# Patient Record
Sex: Male | Born: 1950 | Race: Asian | Hispanic: No | Marital: Married | Smoking: Former smoker
Health system: Southern US, Community
[De-identification: ages and names within clinical notes are randomized; demographics above are authoritative.]

## PROBLEM LIST (undated history)

## (undated) DIAGNOSIS — I209 Angina pectoris, unspecified: Secondary | ICD-10-CM

## (undated) DIAGNOSIS — E119 Type 2 diabetes mellitus without complications: Secondary | ICD-10-CM

## (undated) DIAGNOSIS — I219 Acute myocardial infarction, unspecified: Secondary | ICD-10-CM

## (undated) DIAGNOSIS — I10 Essential (primary) hypertension: Secondary | ICD-10-CM

## (undated) HISTORY — PX: OTHER SURGICAL HISTORY: SHX169

## (undated) HISTORY — PX: NO PAST SURGERIES: SHX2092

---

## 1998-09-15 DIAGNOSIS — I10 Essential (primary) hypertension: Secondary | ICD-10-CM

## 1998-09-15 HISTORY — DX: Essential (primary) hypertension: I10

## 2007-03-03 ENCOUNTER — Emergency Department (HOSPITAL_COMMUNITY): Admission: EM | Admit: 2007-03-03 | Discharge: 2007-03-03 | Payer: Self-pay | Admitting: Emergency Medicine

## 2010-03-04 ENCOUNTER — Ambulatory Visit: Payer: Self-pay | Admitting: Internal Medicine

## 2010-03-04 ENCOUNTER — Encounter (INDEPENDENT_AMBULATORY_CARE_PROVIDER_SITE_OTHER): Payer: Self-pay | Admitting: Family Medicine

## 2010-03-04 LAB — CONVERTED CEMR LAB
ALT: 18 units/L (ref 0–53)
AST: 16 units/L (ref 0–37)
Alkaline Phosphatase: 58 units/L (ref 39–117)
Basophils Relative: 0 % (ref 0–1)
CO2: 21 meq/L (ref 19–32)
MCHC: 34.3 g/dL (ref 30.0–36.0)
Magnesium: 2.1 mg/dL (ref 1.5–2.5)
Monocytes Relative: 8 % (ref 3–12)
Neutro Abs: 4.1 10*3/uL (ref 1.7–7.7)
Neutrophils Relative %: 57 % (ref 43–77)
Platelets: 311 10*3/uL (ref 150–400)
RBC: 4.03 M/uL — ABNORMAL LOW (ref 4.22–5.81)
Sodium: 141 meq/L (ref 135–145)
Total Bilirubin: 0.3 mg/dL (ref 0.3–1.2)
Total Protein: 7.7 g/dL (ref 6.0–8.3)
WBC: 7.2 10*3/uL (ref 4.0–10.5)

## 2010-03-07 ENCOUNTER — Encounter (INDEPENDENT_AMBULATORY_CARE_PROVIDER_SITE_OTHER): Payer: Self-pay | Admitting: Family Medicine

## 2010-03-07 LAB — CONVERTED CEMR LAB
Iron: 71 ug/dL (ref 42–165)
Saturation Ratios: 28 % (ref 20–55)
TIBC: 257 ug/dL (ref 215–435)
UIBC: 186 ug/dL

## 2010-03-19 ENCOUNTER — Ambulatory Visit: Payer: Self-pay | Admitting: Family Medicine

## 2010-03-20 ENCOUNTER — Encounter (INDEPENDENT_AMBULATORY_CARE_PROVIDER_SITE_OTHER): Payer: Self-pay | Admitting: Family Medicine

## 2010-03-20 LAB — CONVERTED CEMR LAB
BUN: 31 mg/dL — ABNORMAL HIGH (ref 6–23)
Calcium: 10.3 mg/dL (ref 8.4–10.5)
Creatinine, Ser: 1.43 mg/dL (ref 0.40–1.50)
Glucose, Bld: 104 mg/dL — ABNORMAL HIGH (ref 70–99)
Hgb A1c MFr Bld: 6.9 % — ABNORMAL HIGH (ref <5.7)
Phosphorus: 2.4 mg/dL (ref 2.3–4.6)

## 2010-04-05 ENCOUNTER — Ambulatory Visit: Payer: Self-pay | Admitting: Internal Medicine

## 2010-05-09 ENCOUNTER — Ambulatory Visit: Payer: Self-pay | Admitting: Internal Medicine

## 2010-05-09 ENCOUNTER — Encounter (INDEPENDENT_AMBULATORY_CARE_PROVIDER_SITE_OTHER): Payer: Self-pay | Admitting: Family Medicine

## 2010-05-09 LAB — CONVERTED CEMR LAB
BUN: 29 mg/dL — ABNORMAL HIGH (ref 6–23)
Basophils Relative: 0 % (ref 0–1)
Calcium: 9.9 mg/dL (ref 8.4–10.5)
Cholesterol: 256 mg/dL — ABNORMAL HIGH (ref 0–200)
Eosinophils Absolute: 0.7 10*3/uL (ref 0.0–0.7)
Eosinophils Relative: 10 % — ABNORMAL HIGH (ref 0–5)
Glucose, Bld: 119 mg/dL — ABNORMAL HIGH (ref 70–99)
HCT: 43.4 % (ref 39.0–52.0)
Lymphs Abs: 2.4 10*3/uL (ref 0.7–4.0)
MCHC: 30.9 g/dL (ref 30.0–36.0)
MCV: 96.9 fL (ref 78.0–100.0)
Monocytes Relative: 6 % (ref 3–12)
Neutrophils Relative %: 51 % (ref 43–77)
Platelets: 315 10*3/uL (ref 150–400)
Potassium: 4 meq/L (ref 3.5–5.3)
Sodium: 139 meq/L (ref 135–145)
Triglycerides: 343 mg/dL — ABNORMAL HIGH (ref <150)
VLDL: 69 mg/dL — ABNORMAL HIGH (ref 0–40)

## 2013-04-15 DIAGNOSIS — E119 Type 2 diabetes mellitus without complications: Secondary | ICD-10-CM

## 2013-04-15 HISTORY — DX: Type 2 diabetes mellitus without complications: E11.9

## 2013-05-09 ENCOUNTER — Encounter (HOSPITAL_COMMUNITY): Payer: Self-pay | Admitting: Certified Registered"

## 2013-05-09 ENCOUNTER — Encounter (HOSPITAL_COMMUNITY)
Admission: EM | Disposition: A | Discharge status: Home / Self Care | Payer: Self-pay | Source: Home / Self Care | Attending: Critical Care Medicine

## 2013-05-09 ENCOUNTER — Emergency Department (HOSPITAL_COMMUNITY): Payer: Medicaid Other

## 2013-05-09 ENCOUNTER — Ambulatory Visit (HOSPITAL_COMMUNITY): Admit: 2013-05-09 | Payer: Self-pay | Admitting: Cardiology

## 2013-05-09 ENCOUNTER — Inpatient Hospital Stay (HOSPITAL_COMMUNITY)
Admission: EM | Admit: 2013-05-09 | Discharge: 2013-05-19 | DRG: 246 | Disposition: A | Discharge status: Home / Self Care | Payer: Medicaid Other | Attending: Cardiology | Admitting: Cardiology

## 2013-05-09 ENCOUNTER — Inpatient Hospital Stay (HOSPITAL_COMMUNITY): Payer: Medicaid Other

## 2013-05-09 DIAGNOSIS — R000 Tachycardia, unspecified: Secondary | ICD-10-CM

## 2013-05-09 DIAGNOSIS — Z87891 Personal history of nicotine dependence: Secondary | ICD-10-CM

## 2013-05-09 DIAGNOSIS — I10 Essential (primary) hypertension: Secondary | ICD-10-CM

## 2013-05-09 DIAGNOSIS — J1569 Pneumonia due to other gram-negative bacteria: Secondary | ICD-10-CM | POA: Diagnosis not present

## 2013-05-09 DIAGNOSIS — I472 Ventricular tachycardia, unspecified: Secondary | ICD-10-CM

## 2013-05-09 DIAGNOSIS — I498 Other specified cardiac arrhythmias: Secondary | ICD-10-CM | POA: Diagnosis present

## 2013-05-09 DIAGNOSIS — E8779 Other fluid overload: Secondary | ICD-10-CM | POA: Diagnosis not present

## 2013-05-09 DIAGNOSIS — E872 Acidosis, unspecified: Secondary | ICD-10-CM

## 2013-05-09 DIAGNOSIS — G9340 Encephalopathy, unspecified: Secondary | ICD-10-CM

## 2013-05-09 DIAGNOSIS — I213 ST elevation (STEMI) myocardial infarction of unspecified site: Secondary | ICD-10-CM

## 2013-05-09 DIAGNOSIS — Z955 Presence of coronary angioplasty implant and graft: Secondary | ICD-10-CM

## 2013-05-09 DIAGNOSIS — I2119 ST elevation (STEMI) myocardial infarction involving other coronary artery of inferior wall: Principal | ICD-10-CM | POA: Diagnosis present

## 2013-05-09 DIAGNOSIS — G931 Anoxic brain damage, not elsewhere classified: Secondary | ICD-10-CM | POA: Diagnosis present

## 2013-05-09 DIAGNOSIS — I4901 Ventricular fibrillation: Secondary | ICD-10-CM | POA: Diagnosis present

## 2013-05-09 DIAGNOSIS — Z79899 Other long term (current) drug therapy: Secondary | ICD-10-CM

## 2013-05-09 DIAGNOSIS — J9601 Acute respiratory failure with hypoxia: Secondary | ICD-10-CM

## 2013-05-09 DIAGNOSIS — J690 Pneumonitis due to inhalation of food and vomit: Secondary | ICD-10-CM | POA: Diagnosis not present

## 2013-05-09 DIAGNOSIS — R570 Cardiogenic shock: Secondary | ICD-10-CM | POA: Diagnosis present

## 2013-05-09 DIAGNOSIS — Z7902 Long term (current) use of antithrombotics/antiplatelets: Secondary | ICD-10-CM

## 2013-05-09 DIAGNOSIS — IMO0002 Reserved for concepts with insufficient information to code with codable children: Secondary | ICD-10-CM | POA: Diagnosis not present

## 2013-05-09 DIAGNOSIS — Z7982 Long term (current) use of aspirin: Secondary | ICD-10-CM

## 2013-05-09 DIAGNOSIS — I4729 Other ventricular tachycardia: Secondary | ICD-10-CM | POA: Diagnosis present

## 2013-05-09 DIAGNOSIS — I2510 Atherosclerotic heart disease of native coronary artery without angina pectoris: Secondary | ICD-10-CM | POA: Diagnosis present

## 2013-05-09 DIAGNOSIS — N179 Acute kidney failure, unspecified: Secondary | ICD-10-CM

## 2013-05-09 DIAGNOSIS — E7800 Pure hypercholesterolemia, unspecified: Secondary | ICD-10-CM | POA: Diagnosis present

## 2013-05-09 DIAGNOSIS — E876 Hypokalemia: Secondary | ICD-10-CM

## 2013-05-09 DIAGNOSIS — F05 Delirium due to known physiological condition: Secondary | ICD-10-CM | POA: Diagnosis not present

## 2013-05-09 DIAGNOSIS — I219 Acute myocardial infarction, unspecified: Secondary | ICD-10-CM

## 2013-05-09 DIAGNOSIS — E119 Type 2 diabetes mellitus without complications: Secondary | ICD-10-CM | POA: Diagnosis present

## 2013-05-09 DIAGNOSIS — D649 Anemia, unspecified: Secondary | ICD-10-CM | POA: Diagnosis not present

## 2013-05-09 DIAGNOSIS — J156 Pneumonia due to other aerobic Gram-negative bacteria: Secondary | ICD-10-CM | POA: Diagnosis not present

## 2013-05-09 DIAGNOSIS — I469 Cardiac arrest, cause unspecified: Secondary | ICD-10-CM

## 2013-05-09 DIAGNOSIS — J9600 Acute respiratory failure, unspecified whether with hypoxia or hypercapnia: Secondary | ICD-10-CM | POA: Diagnosis present

## 2013-05-09 DIAGNOSIS — Z9911 Dependence on respirator [ventilator] status: Secondary | ICD-10-CM

## 2013-05-09 DIAGNOSIS — I2582 Chronic total occlusion of coronary artery: Secondary | ICD-10-CM | POA: Diagnosis present

## 2013-05-09 HISTORY — DX: Essential (primary) hypertension: I10

## 2013-05-09 HISTORY — PX: LEFT HEART CATHETERIZATION WITH CORONARY ANGIOGRAM: SHX5451

## 2013-05-09 HISTORY — DX: Angina pectoris, unspecified: I20.9

## 2013-05-09 HISTORY — DX: Type 2 diabetes mellitus without complications: E11.9

## 2013-05-09 HISTORY — DX: Acute myocardial infarction, unspecified: I21.9

## 2013-05-09 LAB — CBC
MCH: 30.4 pg (ref 26.0–34.0)
MCHC: 34.7 g/dL (ref 30.0–36.0)
MCV: 87.6 fL (ref 78.0–100.0)
Platelets: 274 10*3/uL (ref 150–400)
Platelets: 325 10*3/uL (ref 150–400)
RBC: 3.38 MIL/uL — ABNORMAL LOW (ref 4.22–5.81)
RDW: 13 % (ref 11.5–15.5)
RDW: 13 % (ref 11.5–15.5)
WBC: 18.7 10*3/uL — ABNORMAL HIGH (ref 4.0–10.5)
WBC: 9.2 10*3/uL (ref 4.0–10.5)

## 2013-05-09 LAB — GLUCOSE, CAPILLARY
Glucose-Capillary: 554 mg/dL (ref 70–99)
Glucose-Capillary: 507 mg/dL — ABNORMAL HIGH (ref 70–99)
Glucose-Capillary: 255 mg/dL — ABNORMAL HIGH (ref 70–99)
Glucose-Capillary: 249 mg/dL — ABNORMAL HIGH (ref 70–99)
Glucose-Capillary: 234 mg/dL — ABNORMAL HIGH (ref 70–99)

## 2013-05-09 LAB — MAGNESIUM: Magnesium: 2 mg/dL (ref 1.5–2.5)

## 2013-05-09 LAB — COMPREHENSIVE METABOLIC PANEL
ALT: 204 U/L — ABNORMAL HIGH (ref 0–53)
ALT: 225 U/L — ABNORMAL HIGH (ref 0–53)
AST: 226 U/L — ABNORMAL HIGH (ref 0–37)
Albumin: 2.3 g/dL — ABNORMAL LOW (ref 3.5–5.2)
Alkaline Phosphatase: 75 U/L (ref 39–117)
Alkaline Phosphatase: 59 U/L (ref 39–117)
CO2: 19 mEq/L (ref 19–32)
CO2: 16 mEq/L — ABNORMAL LOW (ref 19–32)
Chloride: 101 mEq/L (ref 96–112)
Creatinine, Ser: 1.44 mg/dL — ABNORMAL HIGH (ref 0.50–1.35)
GFR calc Af Amer: 52 mL/min — ABNORMAL LOW (ref >90)
GFR calc non Af Amer: 45 mL/min — ABNORMAL LOW (ref >90)
GFR calc non Af Amer: 53 mL/min — ABNORMAL LOW (ref >90)
Glucose, Bld: 523 mg/dL — ABNORMAL HIGH (ref 70–99)
Potassium: 2.4 mEq/L — CL (ref 3.5–5.1)
Potassium: 3.2 mEq/L — ABNORMAL LOW (ref 3.5–5.1)
Sodium: 134 mEq/L — ABNORMAL LOW (ref 135–145)
Sodium: 138 mEq/L (ref 135–145)
Total Bilirubin: 0.1 mg/dL — ABNORMAL LOW (ref 0.3–1.2)
Total Bilirubin: 0.1 mg/dL — ABNORMAL LOW (ref 0.3–1.2)

## 2013-05-09 LAB — POCT I-STAT 3, ART BLOOD GAS (G3+)
O2 Saturation: 100 %
O2 Saturation: 93 %
Patient temperature: 32.8
Patient temperature: 32.8
TCO2: 12 mmol/L (ref 0–100)
TCO2: 13 mmol/L (ref 0–100)

## 2013-05-09 LAB — BASIC METABOLIC PANEL
BUN: 33 mg/dL — ABNORMAL HIGH (ref 6–23)
BUN: 31 mg/dL — ABNORMAL HIGH (ref 6–23)
BUN: 25 mg/dL — ABNORMAL HIGH (ref 6–23)
CO2: 16 mEq/L — ABNORMAL LOW (ref 19–32)
CO2: 13 mEq/L — ABNORMAL LOW (ref 19–32)
CO2: 12 mEq/L — ABNORMAL LOW (ref 19–32)
Calcium: 7.5 mg/dL — ABNORMAL LOW (ref 8.4–10.5)
Calcium: 7.3 mg/dL — ABNORMAL LOW (ref 8.4–10.5)
Chloride: 109 mEq/L (ref 96–112)
Chloride: 112 mEq/L (ref 96–112)
Creatinine, Ser: 1.26 mg/dL (ref 0.50–1.35)
Creatinine, Ser: 1.19 mg/dL (ref 0.50–1.35)
GFR calc Af Amer: 72 mL/min — ABNORMAL LOW (ref >90)
GFR calc non Af Amer: 65 mL/min — ABNORMAL LOW (ref >90)
GFR calc non Af Amer: 75 mL/min — ABNORMAL LOW (ref >90)
Glucose, Bld: 601 mg/dL (ref 70–99)
Glucose, Bld: 396 mg/dL — ABNORMAL HIGH (ref 70–99)
Glucose, Bld: 266 mg/dL — ABNORMAL HIGH (ref 70–99)
Glucose, Bld: 237 mg/dL — ABNORMAL HIGH (ref 70–99)
Potassium: 2.8 mEq/L — ABNORMAL LOW (ref 3.5–5.1)
Potassium: 4.6 mEq/L (ref 3.5–5.1)
Sodium: 138 mEq/L (ref 135–145)

## 2013-05-09 LAB — BLOOD GAS, ARTERIAL
Acid-base deficit: 9.7 mmol/L — ABNORMAL HIGH (ref 0.0–2.0)
Drawn by: 13898
FIO2: 1 %
MECHVT: 500 mL
O2 Saturation: 93.2 %
RATE: 18 resp/min
TCO2: 17.6 mmol/L (ref 0–100)
pO2, Arterial: 75.2 mmHg — ABNORMAL LOW (ref 80.0–100.0)

## 2013-05-09 LAB — POCT ACTIVATED CLOTTING TIME
Activated Clotting Time: 135 seconds
Activated Clotting Time: 375 seconds
Activated Clotting Time: 150 seconds

## 2013-05-09 LAB — PROTIME-INR
INR: 2.21 — ABNORMAL HIGH (ref 0.00–1.49)
INR: 1.44 (ref 0.00–1.49)
Prothrombin Time: 23.8 seconds — ABNORMAL HIGH (ref 11.6–15.2)
Prothrombin Time: 17.2 seconds — ABNORMAL HIGH (ref 11.6–15.2)

## 2013-05-09 LAB — CBC WITH DIFFERENTIAL/PLATELET
Basophils Absolute: 0 10*3/uL (ref 0.0–0.1)
Basophils Relative: 0 % (ref 0–1)
Eosinophils Absolute: 0.8 10*3/uL — ABNORMAL HIGH (ref 0.0–0.7)
Eosinophils Absolute: 0.1 10*3/uL (ref 0.0–0.7)
Eosinophils Relative: 1 % (ref 0–5)
Hemoglobin: 12.5 g/dL — ABNORMAL LOW (ref 13.0–17.0)
Lymphocytes Relative: 13 % (ref 12–46)
MCH: 30.7 pg (ref 26.0–34.0)
MCH: 30.6 pg (ref 26.0–34.0)
MCHC: 33.8 g/dL (ref 30.0–36.0)
MCV: 87.8 fL (ref 78.0–100.0)
Monocytes Absolute: 1 10*3/uL (ref 0.1–1.0)
Neutrophils Relative %: 56 % (ref 43–77)
Neutrophils Relative %: 80 % — ABNORMAL HIGH (ref 43–77)
Platelets: 240 10*3/uL (ref 150–400)
Platelets: 315 10*3/uL (ref 150–400)
RBC: 4.18 MIL/uL — ABNORMAL LOW (ref 4.22–5.81)
RDW: 13 % (ref 11.5–15.5)
WBC: 13.4 10*3/uL — ABNORMAL HIGH (ref 4.0–10.5)

## 2013-05-09 LAB — APTT
aPTT: 29 seconds (ref 24–37)
aPTT: 38 seconds — ABNORMAL HIGH (ref 24–37)

## 2013-05-09 LAB — MRSA PCR SCREENING: MRSA by PCR: NEGATIVE

## 2013-05-09 LAB — LIPID PANEL
Cholesterol: 138 mg/dL (ref 0–200)
Cholesterol: 196 mg/dL (ref 0–200)
Triglycerides: 263 mg/dL — ABNORMAL HIGH (ref <150)
VLDL: 53 mg/dL — ABNORMAL HIGH (ref 0–40)

## 2013-05-09 LAB — LACTIC ACID, PLASMA: Lactic Acid, Venous: 5.7 mmol/L — ABNORMAL HIGH (ref 0.5–2.2)

## 2013-05-09 LAB — PRO B NATRIURETIC PEPTIDE: Pro B Natriuretic peptide (BNP): 59.6 pg/mL (ref 0–125)

## 2013-05-09 LAB — CK TOTAL AND CKMB (NOT AT ARMC)
CK, MB: 16.1 ng/mL (ref 0.3–4.0)
Relative Index: 5.8 — ABNORMAL HIGH (ref 0.0–2.5)

## 2013-05-09 LAB — PHOSPHORUS: Phosphorus: 1 mg/dL — CL (ref 2.3–4.6)

## 2013-05-09 LAB — TROPONIN I: Troponin I: >20 ng/mL (ref <0.30)

## 2013-05-09 SURGERY — LEFT HEART CATHETERIZATION WITH CORONARY ANGIOGRAM
Anesthesia: Choice | Laterality: Bilateral

## 2013-05-09 MED ORDER — SODIUM CHLORIDE 0.9 % IV SOLN: 0.5000 ug/kg/min | INTRAVENOUS | Status: DC

## 2013-05-09 MED ORDER — ONDANSETRON HCL 4 MG/2ML IJ SOLN: 4.0000 mg | Freq: Four times a day (QID) | INTRAMUSCULAR | Status: DC | PRN

## 2013-05-09 MED ORDER — ONDANSETRON HCL 4 MG/2ML IJ SOLN
INTRAMUSCULAR | Status: AC
  Filled 2013-05-09: qty 2

## 2013-05-09 MED ORDER — ASPIRIN 81 MG PO CHEW: 324.0000 mg | CHEWABLE_TABLET | ORAL | Status: AC

## 2013-05-09 MED ORDER — EPTIFIBATIDE 75 MG/100ML IV SOLN
1.0000 ug/kg/min | INTRAVENOUS | Status: AC
  Filled 2013-05-09 (×3): qty 100

## 2013-05-09 MED ORDER — INSULIN ASPART 100 UNIT/ML ~~LOC~~ SOLN: 0.0000 [IU] | SUBCUTANEOUS | Status: DC

## 2013-05-09 MED ORDER — LIDOCAINE HCL (PF) 1 % IJ SOLN
INTRAMUSCULAR | Status: AC
  Filled 2013-05-09: qty 30

## 2013-05-09 MED ORDER — CISATRACURIUM BOLUS VIA INFUSION: 0.1000 mg/kg | Freq: Once | INTRAVENOUS | Status: DC

## 2013-05-09 MED ORDER — SODIUM CHLORIDE 0.9 % IV SOLN
1.0000 ug/kg/min | INTRAVENOUS | Status: DC
  Administered 2013-05-09: 1 ug/kg/min via INTRAVENOUS
  Administered 2013-05-10: 1.5 ug/kg/min via INTRAVENOUS
  Filled 2013-05-09 (×2): qty 20

## 2013-05-09 MED ORDER — ASPIRIN 300 MG RE SUPP: 300.0000 mg | RECTAL | Status: DC

## 2013-05-09 MED ORDER — METOPROLOL TARTRATE 12.5 MG HALF TABLET
12.5000 mg | ORAL_TABLET | Freq: Two times a day (BID) | ORAL | Status: DC
  Filled 2013-05-09 (×2): qty 1

## 2013-05-09 MED ORDER — TICAGRELOR 90 MG PO TABS
180.0000 mg | ORAL_TABLET | Freq: Once | ORAL | Status: AC
  Administered 2013-05-09: 180 mg via ORAL
  Filled 2013-05-09: qty 2

## 2013-05-09 MED ORDER — NOREPINEPHRINE BITARTRATE 1 MG/ML IJ SOLN
0.5000 ug/min | INTRAVENOUS | Status: DC
  Filled 2013-05-09 (×2): qty 4

## 2013-05-09 MED ORDER — SODIUM CHLORIDE 0.9 % IV SOLN: INTRAVENOUS | Status: AC

## 2013-05-09 MED ORDER — HEPARIN (PORCINE) IN NACL 2-0.9 UNIT/ML-% IJ SOLN
INTRAMUSCULAR | Status: AC
  Filled 2013-05-09: qty 1000

## 2013-05-09 MED ORDER — CISATRACURIUM BOLUS VIA INFUSION
0.0500 mg/kg | Freq: Once | INTRAVENOUS | Status: AC | PRN
  Filled 2013-05-09: qty 3

## 2013-05-09 MED ORDER — EPTIFIBATIDE 75 MG/100ML IV SOLN
INTRAVENOUS | Status: AC
  Filled 2013-05-09: qty 100

## 2013-05-09 MED ORDER — NITROGLYCERIN 0.2 MG/ML ON CALL CATH LAB
INTRAVENOUS | Status: AC
  Filled 2013-05-09: qty 1

## 2013-05-09 MED ORDER — DOPAMINE-DEXTROSE 3.2-5 MG/ML-% IV SOLN
INTRAVENOUS | Status: AC
  Filled 2013-05-09: qty 250

## 2013-05-09 MED ORDER — TICAGRELOR 90 MG PO TABS
90.0000 mg | ORAL_TABLET | Freq: Two times a day (BID) | ORAL | Status: DC
  Administered 2013-05-09 – 2013-05-14 (×10): 90 mg via ORAL
  Filled 2013-05-09 (×11): qty 1

## 2013-05-09 MED ORDER — ASPIRIN 300 MG RE SUPP
300.0000 mg | RECTAL | Status: AC
  Administered 2013-05-09: 300 mg via RECTAL
  Filled 2013-05-09: qty 1

## 2013-05-09 MED ORDER — AMIODARONE HCL IN DEXTROSE 360-4.14 MG/200ML-% IV SOLN
INTRAVENOUS | Status: AC
  Filled 2013-05-09: qty 200

## 2013-05-09 MED ORDER — NITROGLYCERIN 0.4 MG SL SUBL: 0.4000 mg | SUBLINGUAL_TABLET | SUBLINGUAL | Status: DC | PRN

## 2013-05-09 MED ORDER — HEPARIN SODIUM (PORCINE) 5000 UNIT/ML IJ SOLN
5000.0000 [IU] | Freq: Three times a day (TID) | INTRAMUSCULAR | Status: DC
  Filled 2013-05-09 (×4): qty 1

## 2013-05-09 MED ORDER — SUCCINYLCHOLINE CHLORIDE 20 MG/ML IJ SOLN
INTRAMUSCULAR | Status: AC
  Administered 2013-05-09: 120 mg
  Filled 2013-05-09: qty 1

## 2013-05-09 MED ORDER — HEPARIN SODIUM (PORCINE) 5000 UNIT/ML IJ SOLN
5000.0000 [IU] | Freq: Three times a day (TID) | INTRAMUSCULAR | Status: DC
  Administered 2013-05-09 – 2013-05-16 (×21): 5000 [IU] via SUBCUTANEOUS
  Filled 2013-05-09 (×25): qty 1

## 2013-05-09 MED ORDER — POTASSIUM CHLORIDE 10 MEQ/100ML IV SOLN
10.0000 meq | Freq: Once | INTRAVENOUS | Status: AC
  Administered 2013-05-09: 10 meq via INTRAVENOUS
  Filled 2013-05-09: qty 100

## 2013-05-09 MED ORDER — SODIUM BICARBONATE 8.4 % IV SOLN
INTRAVENOUS | Status: AC
  Filled 2013-05-09: qty 50

## 2013-05-09 MED ORDER — SODIUM CHLORIDE 0.9 % IV SOLN
0.5000 ug/kg/min | INTRAVENOUS | Status: DC
  Filled 2013-05-09: qty 20

## 2013-05-09 MED ORDER — EPINEPHRINE HCL 0.1 MG/ML IJ SOSY
PREFILLED_SYRINGE | INTRAMUSCULAR | Status: AC
  Filled 2013-05-09: qty 10

## 2013-05-09 MED ORDER — SODIUM CHLORIDE 0.9 % IV SOLN
1.0000 mg/h | INTRAVENOUS | Status: DC
  Administered 2013-05-09 (×2): 2 mg/h via INTRAVENOUS
  Filled 2013-05-09 (×3): qty 10

## 2013-05-09 MED ORDER — TICAGRELOR 90 MG PO TABS: 180.0000 mg | ORAL_TABLET | Freq: Once | ORAL | Status: DC

## 2013-05-09 MED ORDER — BIVALIRUDIN 250 MG IV SOLR
INTRAVENOUS | Status: AC
  Filled 2013-05-09: qty 250

## 2013-05-09 MED ORDER — PANTOPRAZOLE SODIUM 40 MG IV SOLR
40.0000 mg | INTRAVENOUS | Status: DC
  Administered 2013-05-09 – 2013-05-14 (×6): 40 mg via INTRAVENOUS
  Filled 2013-05-09 (×6): qty 40

## 2013-05-09 MED ORDER — ATROPINE SULFATE 1 MG/ML IJ SOLN
INTRAMUSCULAR | Status: AC
  Filled 2013-05-09: qty 1

## 2013-05-09 MED ORDER — ALBUTEROL SULFATE HFA 108 (90 BASE) MCG/ACT IN AERS: 4.0000 | INHALATION_SPRAY | RESPIRATORY_TRACT | Status: DC | PRN

## 2013-05-09 MED ORDER — DEXTROSE 5 % IV SOLN
0.5000 ug/min | INTRAVENOUS | Status: DC
  Filled 2013-05-09: qty 4

## 2013-05-09 MED ORDER — LIDOCAINE HCL (CARDIAC) 20 MG/ML IV SOLN
INTRAVENOUS | Status: AC
  Filled 2013-05-09: qty 5

## 2013-05-09 MED ORDER — PANTOPRAZOLE SODIUM 40 MG IV SOLR: 40.0000 mg | INTRAVENOUS | Status: DC

## 2013-05-09 MED ORDER — SODIUM CHLORIDE 0.9 % IV SOLN
25.0000 ug/h | INTRAVENOUS | Status: DC
  Administered 2013-05-09 (×2): 50 ug/h via INTRAVENOUS
  Filled 2013-05-09 (×2): qty 50

## 2013-05-09 MED ORDER — ROCURONIUM BROMIDE 50 MG/5ML IV SOLN
INTRAVENOUS | Status: AC
  Filled 2013-05-09: qty 2

## 2013-05-09 MED ORDER — DEXTROSE 5 % IV SOLN
0.5000 ug/min | INTRAVENOUS | Status: DC
  Administered 2013-05-09: 15 ug/min via INTRAVENOUS
  Administered 2013-05-10: 5 ug/min via INTRAVENOUS
  Administered 2013-05-11: 2.5 ug/min via INTRAVENOUS
  Filled 2013-05-09 (×7): qty 16

## 2013-05-09 MED ORDER — CISATRACURIUM BOLUS VIA INFUSION: 0.0500 mg/kg | Freq: Once | INTRAVENOUS | Status: DC | PRN

## 2013-05-09 MED ORDER — VERAPAMIL HCL 2.5 MG/ML IV SOLN
INTRAVENOUS | Status: AC
  Filled 2013-05-09: qty 2

## 2013-05-09 MED ORDER — ARTIFICIAL TEARS OP OINT
1.0000 "application " | TOPICAL_OINTMENT | Freq: Three times a day (TID) | OPHTHALMIC | Status: DC
  Administered 2013-05-09 – 2013-05-11 (×7): 1 via OPHTHALMIC
  Filled 2013-05-09 (×2): qty 3.5

## 2013-05-09 MED ORDER — PHENYLEPHRINE HCL 10 MG/ML IJ SOLN
30.0000 ug/min | INTRAVENOUS | Status: DC
  Administered 2013-05-09: 5 ug/min via INTRAVENOUS
  Administered 2013-05-09: 30 ug/min via INTRAVENOUS
  Filled 2013-05-09 (×2): qty 4

## 2013-05-09 MED ORDER — SODIUM CHLORIDE 0.9 % IV SOLN: 1.0000 ug/kg/min | INTRAVENOUS | Status: DC

## 2013-05-09 MED ORDER — BIOTENE DRY MOUTH MT LIQD
15.0000 mL | Freq: Four times a day (QID) | OROMUCOSAL | Status: DC
  Administered 2013-05-09 – 2013-05-15 (×25): 15 mL via OROMUCOSAL

## 2013-05-09 MED ORDER — POTASSIUM CHLORIDE 10 MEQ/50ML IV SOLN
10.0000 meq | INTRAVENOUS | Status: AC
  Administered 2013-05-09 (×6): 10 meq via INTRAVENOUS
  Filled 2013-05-09: qty 300

## 2013-05-09 MED ORDER — SODIUM CHLORIDE 0.9 % IV SOLN
INTRAVENOUS | Status: DC
  Administered 2013-05-09: 4.7 [IU]/h via INTRAVENOUS
  Filled 2013-05-09 (×3): qty 1

## 2013-05-09 MED ORDER — NITROGLYCERIN IN D5W 200-5 MCG/ML-% IV SOLN: 5.0000 ug/min | INTRAVENOUS | Status: DC

## 2013-05-09 MED ORDER — ASPIRIN 300 MG RE SUPP: 300.0000 mg | Freq: Once | RECTAL | Status: DC

## 2013-05-09 MED ORDER — POTASSIUM PHOSPHATE DIBASIC 3 MMOLE/ML IV SOLN
30.0000 mmol | Freq: Once | INTRAVENOUS | Status: AC
  Administered 2013-05-09: 30 mmol via INTRAVENOUS
  Filled 2013-05-09: qty 10

## 2013-05-09 MED ORDER — CHLORHEXIDINE GLUCONATE 0.12 % MT SOLN
15.0000 mL | Freq: Two times a day (BID) | OROMUCOSAL | Status: DC
  Administered 2013-05-09 – 2013-05-15 (×13): 15 mL via OROMUCOSAL
  Filled 2013-05-09 (×14): qty 15

## 2013-05-09 MED ORDER — ETOMIDATE 2 MG/ML IV SOLN
INTRAVENOUS | Status: AC
  Administered 2013-05-09: 30 mg
  Filled 2013-05-09: qty 20

## 2013-05-09 MED ORDER — PHENYLEPHRINE HCL 10 MG/ML IJ SOLN
INTRAMUSCULAR | Status: AC
  Filled 2013-05-09: qty 1

## 2013-05-09 MED ORDER — MAGNESIUM SULFATE 40 MG/ML IJ SOLN
2.0000 g | Freq: Once | INTRAMUSCULAR | Status: AC
  Administered 2013-05-09: 2 g via INTRAVENOUS
  Filled 2013-05-09: qty 50

## 2013-05-09 MED ORDER — EPTIFIBATIDE 75 MG/100ML IV SOLN: 1.0000 ug/kg/min | INTRAVENOUS | Status: DC

## 2013-05-09 MED ORDER — INSULIN GLARGINE 100 UNIT/ML ~~LOC~~ SOLN
20.0000 [IU] | Freq: Every day | SUBCUTANEOUS | Status: DC
  Filled 2013-05-09: qty 0.2

## 2013-05-09 MED ORDER — ACETAMINOPHEN 325 MG PO TABS
650.0000 mg | ORAL_TABLET | ORAL | Status: DC | PRN
  Administered 2013-05-11: 650 mg via ORAL
  Filled 2013-05-09: qty 2

## 2013-05-09 MED ORDER — AMIODARONE HCL IN DEXTROSE 360-4.14 MG/200ML-% IV SOLN
60.0000 mg/h | INTRAVENOUS | Status: AC
  Administered 2013-05-09: 60 mg/h via INTRAVENOUS

## 2013-05-09 MED ORDER — POTASSIUM CHLORIDE 20 MEQ/15ML (10%) PO LIQD
40.0000 meq | Freq: Once | ORAL | Status: AC
  Administered 2013-05-09: 40 meq
  Filled 2013-05-09: qty 30

## 2013-05-09 MED ORDER — ACETAMINOPHEN 325 MG PO TABS: 650.0000 mg | ORAL_TABLET | ORAL | Status: DC | PRN

## 2013-05-09 MED ORDER — CISATRACURIUM BOLUS VIA INFUSION
0.1000 mg/kg | Freq: Once | INTRAVENOUS | Status: AC
  Administered 2013-05-09: 5.5 mg via INTRAVENOUS
  Filled 2013-05-09: qty 6

## 2013-05-09 MED ORDER — ASPIRIN EC 81 MG PO TBEC
81.0000 mg | DELAYED_RELEASE_TABLET | Freq: Every day | ORAL | Status: DC
  Administered 2013-05-10 – 2013-05-13 (×4): 81 mg via ORAL
  Filled 2013-05-09 (×6): qty 1

## 2013-05-09 MED ORDER — ATORVASTATIN CALCIUM 80 MG PO TABS
80.0000 mg | ORAL_TABLET | Freq: Every day | ORAL | Status: DC
  Administered 2013-05-09 – 2013-05-18 (×10): 80 mg via ORAL
  Filled 2013-05-09 (×11): qty 1

## 2013-05-09 MED ORDER — SODIUM CHLORIDE 0.9 % IV SOLN
2000.0000 mL | Freq: Once | INTRAVENOUS | Status: AC
  Administered 2013-05-09: 2000 mL via INTRAVENOUS

## 2013-05-09 MED ORDER — ONDANSETRON HCL 4 MG/2ML IJ SOLN
4.0000 mg | Freq: Once | INTRAMUSCULAR | Status: AC
  Administered 2013-05-09: 4 mg via INTRAVENOUS

## 2013-05-09 NOTE — Progress Notes (Signed)
INITIAL NUTRITION ASSESSMENT  DOCUMENTATION CODES Per approved criteria  -Not Applicable   INTERVENTION: 1.  Enteral nutrition; once re-warmed, and if to remain intubated, recommend initiation of Vital 1.2 @ 20 mL/hr continuous.  Advance by 10 mL q 4 hrs to 60 mL/hr goal to provide 1728 kcal, 108g protein, 1167 mL free water.  NUTRITION DIAGNOSIS: Inadequate oral intake related to inability to eat as evidenced by NPO, intubated.   Monitor:  1.  Enteral nutrition; initiation with tolerance.  Pt to meet >/=90% estimated needs with nutrition support.  2.  Wt/wt change; monitor trends  Reason for Assessment: Vent  61 y.o. male  Admitting Dx: cardiac arrest  ASSESSMENT: Pt admitted after cardiac arrest at home.  Now with possible anoxic brain injury. Patient is currently intubated on ventilator support.  Currently on hypothermia protocol. MV: 12.2 L/min Temp:Temp (24hrs), Avg:92.3 F (33.5 C), Min:91.2 F (32.9 C), Max:97.5 F (36.4 C)  Nutrition Focused Physical Exam:  Subcutaneous Fat:  Orbital Region: WNL Upper Arm Region: WNL Thoracic and Lumbar Region: WNL  Muscle:  Temple Region: WNL Clavicle Bone Region: WNL Clavicle and Acromion Bone Region: WNL Scapular Bone Region: WNL Dorsal Hand: WNL Patellar Region: WNL Anterior Thigh Region: WNL Posterior Calf Region: WNL  Edema: none present    Height: Ht Readings from Last 1 Encounters:  05/09/13 5\' 1"  (1.549 m)    Weight: Wt Readings from Last 1 Encounters:  05/09/13 153 lb (69.4 kg)    Ideal Body Weight: 112 lbs  % Ideal Body Weight: 136%  Wt Readings from Last 10 Encounters:  05/09/13 153 lb (69.4 kg)  05/09/13 153 lb (69.4 kg)    Usual Body Weight: unknown  % Usual Body Weight: unknown  BMI:  Body mass index is 28.92 kg/(m^2).  Estimated Nutritional Needs: Kcal: 1707 Protein: 83-99g Fluid: ~1.8 L/day  Skin: intact  Diet Order: NPO  EDUCATION NEEDS: -Education not appropriate at this  time   Intake/Output Summary (Last 24 hours) at 05/09/13 0917 Last data filed at 05/09/13 0800  Gross per 24 hour  Intake  341.8 ml  Output   2135 ml  Net -1793.2 ml    Last BM: PTA  Labs:   Recent Labs Lab 05/09/13 0220  05/09/13 0402 05/09/13 0433 05/09/13 0600 05/09/13 0824  NA 134*  < > 138 133* 139 140  K 2.4*  < > 3.2* 2.8* 3.1* 3.8  CL 93*  < > 101 98 105 110  CO2 19  --  16* 16*  --   --   BUN 34*  < > 33* 33* 29* 29*  CREATININE 1.66*  < > 1.44* 1.22 1.40* 1.30  CALCIUM 8.5  --  6.6* 7.2*  --   --   MG  --   --   --  2.0  --   --   GLUCOSE 523*  < > 546* 601* 608* 549*  < > = values in this interval not displayed.  CBG (last 3)   Recent Labs  05/09/13 0643 05/09/13 0754 05/09/13 0858  GLUCAP 531* 507* 407*    Scheduled Meds: . antiseptic oral rinse  15 mL Mouth Rinse QID  . artificial tears  1 application Both Eyes Q8H  . [START ON 05/10/2013] aspirin EC  81 mg Oral Daily  . atorvastatin  80 mg Oral q1800  . chlorhexidine  15 mL Mouth Rinse BID  . heparin subcutaneous  5,000 Units Subcutaneous Q8H  . lidocaine (cardiac) 100 mg/40ml      .  ondansetron      . pantoprazole (PROTONIX) IV  40 mg Intravenous Q24H  . potassium chloride  40 mEq Per Tube Once  . rocuronium      . Ticagrelor  90 mg Oral BID    Continuous Infusions: . sodium chloride 150 mL/hr at 05/09/13 0739  . cisatracurium (NIMBEX) infusion 1 mcg/kg/min (05/09/13 0248)  . eptifibatide    . fentaNYL infusion INTRAVENOUS 50 mcg/hr (05/09/13 0732)  . insulin (NOVOLIN-R) infusion 4.7 Units/hr (05/09/13 4782)  . midazolam (VERSED) infusion 2 mg/hr (05/09/13 0733)  . norepinephrine (LEVOPHED) Adult infusion 15 mcg/min (05/09/13 0738)  . norepinephrine (LEVOPHED) Adult infusion    . phenylephrine (NEO-SYNEPHRINE) Adult infusion Stopped (05/09/13 0739)    No past medical history on file.  No past surgical history on file.  Loyce Dys, MS RD LDN Clinical Inpatient  Dietitian Pager: [phone] Weekend/After hours pager: [phone]

## 2013-05-09 NOTE — Procedures (Signed)
Central Venous Catheter Insertion Procedure Note Dustin Vang [id number] 01/20/1956  Procedure: Insertion of Central Venous Catheter Indications: Assessment of intravascular volume, Drug and/or fluid administration and Frequent blood sampling  Procedure Details Consent: Unable to obtain consent because of emergent medical necessity. Time Out: Verified patient identification, verified procedure, site/side was marked, verified correct patient position, special equipment/implants available, medications/allergies/relevent history reviewed, required imaging and test results available.  Performed  Maximum sterile technique was used including antiseptics, cap, gloves, gown, hand hygiene, mask and sheet. Skin prep: Chlorhexidine; local anesthetic administered A antimicrobial bonded/coated triple lumen catheter was placed in the left internal jugular vein using the Seldinger technique and under direct visualization with ultrasound.  Evaluation Blood flow good Complications: No apparent complications Patient did tolerate procedure well. Chest X-ray ordered to verify placement.  CXR: pending.  Overton Mam, M.D. Pulmonary and Critical Care Medicine Call E-link with questions [phone] 05/09/2013, 5:47 AM

## 2013-05-09 NOTE — Procedures (Signed)
Arterial Catheter Insertion Procedure Note Dustin Vang [id number] 01/20/1956  Procedure: Insertion of Arterial Catheter  Indications: Blood pressure monitoring  Procedure Details Consent: Unable to obtain consent because of emergent medical necessity. Time Out: Verified patient identification, verified procedure, site/side was marked, verified correct patient position, special equipment/implants available, medications/allergies/relevent history reviewed, required imaging and test results available.  Performed  Maximum sterile technique was used including antiseptics, gloves, hand hygiene and sheet. Skin prep: Chlorhexidine; local anesthetic administered 22 gauge catheter was inserted into right radial artery using the Seldinger technique.  Evaluation Blood flow good; BP tracing good. Complications: No apparent complications.   Dustin Vang [facility] 05/09/2013

## 2013-05-09 NOTE — ED Notes (Addendum)
Respiratory difficulty today, went to hospital, but felt better and went home. Last seen well @ 0105, EMS called. CPR started at 0110. Per EMS patient exhibited a mix of V-tach and V-Fib. EMS gave 5 doses of Epinephrine, 450mg  of Amiodarone, and shocked patient 4 times prior to arrival. Upon arrival patient had recovered spontaneous circulation. Respirations Agonal upon arrival.

## 2013-05-09 NOTE — Cardiovascular Report (Signed)
NAMETHEADORE, BLUNCK                 ACCOUNT NO.:  [id number]  MEDICAL RECORD NO.:  [id number]  LOCATION:  2H05C                        FACILITY:  [facility]  PHYSICIAN:  Telia Amundson N. Sharyn Lull, M.D. DATE OF BIRTH:  01/20/1956  DATE OF PROCEDURE:  05/09/2013 DATE OF DISCHARGE:                           CARDIAC CATHETERIZATION   PROCEDURE: 1. Left cardiac catheterization with selective left and right coronary     angiography, left ventriculography via right groin using Judkins     technique. 2. Successful PTCA to 100% occluded RCA using 2.5 x 12-mm long Trek     balloon. 3. Successful manual aspiration thrombectomy using Pronto catheter. 4. Successful deployment of 4.0 x 28 mm long Xience drug-eluting stent     in proximal RCA. 5. Successful postdilatation of this stent using 4.5 x 15 mm long [place]     Quantum apex balloon.  INDICATION FOR THE PROCEDURE:  Mr. Jantz is a 61 year old [place] male, with past medical history significant for hypertension, hypercholesteremia, remote tobacco abuse complained of retrosternal chest pain radiating to both arms, associated with mild shortness of breath.  The patient came with family to ER.  Prior to reaching ER, the patient's chest pain resolved and went back home, again restarted severe chest pain radiating to both arm associated with shortness of breath and suddenly collapsed.  CPR was initiated by family member.  EMS was called.  The patient was noted to be in VFib arrest, requiring multiple shocks and epinephrine in the ER.  The patient was intubated.  EKG done in the ER showed sinus tach with more than 10-mm ST-elevation in inferolateral leads with reciprocal changes in lead 1, aVL, and also ST depression in lead V1 and V2 suggestive of acute infero and posterolateral wall MI.  The patient presently was intubated when seen in ER on IV amiodarone, Versed, and fentanyl drip.  The patient was immediately taken to the cath lab and was noted to be  hypotensive with blood pressure of 60 and heart rate in the low 40s.  Discussed with family regarding his critical condition and emergency left cath, possible PTCA stenting, its risks and benefits, i.e., death, MI, stroke, need for emergency CABG, local vascular complications, need for possible intra-aortic balloon pump, etc. and consented for PCI.  DESCRIPTION OF PROCEDURE:  After obtaining the informed consent, the patient was brought to the cath lab and was placed on fluoroscopy table. Right groin was prepped and draped in usual fashion.  A 1% Xylocaine was used for local anesthesia.  With the help of thin wall needle, 6-French arterial and venous sheaths were placed.  Both the sheaths were aspirated and flushed.  The patient received 1 amp of epi and 1 amp of atropine prior to starting the procedure.  His blood pressure went up in 160s systolic and heart rate went up to 110s.  The patient had another episode of hypotension and bradycardia requiring 1 more amp of epi and atropine during the procedure.  The patient was started on IV dopamine and Neo-Synephrine during the procedure.  The patient remained in sinus rhythm during the procedure and did not have any episodes of V-tach or VFib.  A 6-French  left Judkins catheter was advanced over the wire under fluoroscopic guidance up to the ascending aorta.  Wire was pulled out. The catheter was aspirated and connected to the Manifold.  Catheter was further advanced and engaged into left coronary ostium.  Multiple views of the left system were taken.  Next, the catheter was disengaged and was pulled out over the wire and was replaced with 6-French right Judkins guiding catheter, which was advanced over the wire under fluoroscopic guidance up to the ascending aorta.  Wire was pulled out. The catheter was aspirated and connected to the Manifold.  Catheter was further advanced and engaged into right coronary ostium.  A single view of right  coronary artery was obtained.  Next, this catheter was disengaged at the end of the procedure and was replaced with 6-French pigtail catheter, which was advanced over the wire under fluoroscopic guidance up to the ascending aorta.  Wire was pulled out.  The catheter was aspirated and connected to the Manifold.  Catheter was further advanced across the aortic valve into the LV.  LV pressures were recorded.  LV graft was done in 30-degree RAO position.  Post-angiographic pressures were recorded from LV and then pullback pressures were recorded from the aorta.  There was no gradient across the aortic valve.  Next, the pigtail catheter was pulled out over the wire.  Sheaths were aspirated and flushed.  FINDINGS:  LV showed severe inferior wall hypokinesia, EF of 45%-50%. Left main was long which was patent.  LAD was patent.  Diagonal 1 was moderate size, which has mild disease.  Ramus was very very small, which was patent.  Left circumflex was patent.  OM1 was small, which was patent.  OM 2 has 40%-50% proximal stenosis.  RCA was large, which was 100% occluded beyond proximal portion with TIMI 0 distal flow. Interventional procedure, successful PTCA to proximal RCA was done using 2.5 x 12-mm long Trek balloon for predilatation.  Angiogram showed large burden of thrombus burden and then manual aspiration thrombectomy was done using Pronto catheter.  Two passes were done with improvement in proximal thrombus burden, but distal embolization of thrombus to PLV branch.  A 4.0 x 28-mm long Xience drug-eluting stent was deployed in proximal RCA at 13 atmospheric pressure.  The stent was post dilated using a 4.5 x 15 mm long [place] Quantum apex balloon going up to 18-20 atmospheric pressure.  Lesion dilated from 100% to 0% residual with excellent distal flow and PDA, but distal embolization into the small posterolateral branch.  Attempted to wire the small posterolateral branch of RCA without  success, as this branch appears to be occluded to large thrombus burden.  The patient has TIMI grade 3 distal flow.  Otherwise, rest of the posterolateral and PDA branches with excellent results.  The patient received weight based Angiomax and Integrilin during the procedure.  The patient will be receiving aspirin and Brilinta via NG tube.  The patient tolerated the procedure well.  There were no complications.  The patient was transferred to CCU in stable condition.     [name]. Sharyn Lull, M.D.     [facility]/MED[facility]  D:  05/09/2013  T:  05/09/2013  Job:  [id number]

## 2013-05-09 NOTE — Progress Notes (Signed)
Venous Sheath and Arterial Sheath located in Right groin was pulled at 1705, completed hemostasis was achieved at 1725. Site is clean, dry, soft  and intact without hematoma and is a level 0. A 2x2 was placed on the site and then covered with a tegaderm. Will continue to monitor site for signs of bleeding.

## 2013-05-09 NOTE — ED Notes (Signed)
Cardioversion attempted 120j

## 2013-05-09 NOTE — Progress Notes (Signed)
IO removed per MD order from left knee.  Needle intact, Vaseline pressure gauze to site, minimal bleeding after removal. Consuello Masse

## 2013-05-09 NOTE — H&P (Signed)
PULMONARY  / CRITICAL CARE MEDICINE  Name: Dustin Vang MRN: [id number] DOB: 01/20/1956    ADMISSION DATE:  05/09/2013  PRIMARY SERVICE: PCCM  CHIEF COMPLAINT:  Cardiac arrest, STEMI  BRIEF PATIENT DESCRIPTION:  62 years old male admitted after cardiac arrest at home. Initial rhythm at EMS arrival was V fib. Down time about 15 minutes. Return to spontaneous circulation after multiple shocks, epinephrine and amiodarone. EKG with inferolateral STEMI. Cath  100% rca stent.  SIGNIFICANT EVENTS / STUDIES:  EKG: Inferolateral STEMI 8/25- cath stent rca 8/25 hypothermia protocol  LINES / TUBES: 8/24 ett>>> 8/25 rt ij>>> 8/24 rt rad>>> 8/25 rt art Lavera Guise [facility] cath lab>>>  CULTURES: No cultures  ANTIBIOTICS: No antibiotics  SUBJECTIVE: At goal temp  VITAL SIGNS: Temp:  [91.2 F (32.9 C)-97.5 F (36.4 C)] 91.6 F (33.1 C) (08/25 0800) Pulse Rate:  [75-116] 75 (08/25 0800) Resp:  [15-37] 18 (08/25 0800) BP: (107-163)/(72-96) 128/92 mmHg (08/25 0643) SpO2:  [93 %-100 %] 100 % (08/25 0800) Arterial Line BP: (98-125)/(71-86) 114/83 mmHg (08/25 0713) FiO2 (%):  [100 %] 100 % (08/25 0643) Weight:  [55 kg (121 lb 4.1 oz)-69.4 kg (153 lb)] 69.4 kg (153 lb) (08/25 0543) HEMODYNAMICS: CVP:  [11 mmHg] 11 mmHg VENTILATOR SETTINGS: Vent Mode:  [-] PRVC FiO2 (%):  [100 %] 100 % Set Rate:  [14 bmp] 14 bmp Vt Set:  [500 mL-600 mL] 600 mL PEEP:  [5 cmH20] 5 cmH20 Plateau Pressure:  [26 cmH20] 26 cmH20 INTAKE / OUTPUT: Intake/Output     08/24 0701 - 08/25 0700 08/25 0701 - 08/26 0700   I.V. (mL/kg)  241.8 (3.5)   IV Piggyback 100    Total Intake(mL/kg) 100 (1.4) 241.8 (3.5)   Urine (mL/kg/hr) 1700 435 (3.1)   Total Output 1700 435   Net -1600 -193.2          PHYSICAL EXAMINATION: General: paralyzed Neuro:perr 4 mm sluggish HEENT: ett, line  ozzing PULM: ronchi CV: s1 s2 RRR no r GI: soft, BS hypo, nt, nd Extremities: edema none   LABS:  CBC Recent Labs      05/09/13  0220   05/09/13  0359  05/09/13  0433  05/09/13  0600  05/09/13  0824  WBC  20.3*   --   18.7*  13.4*   --    --   HGB  12.5*   < >  10.3*  12.8*  13.6  12.9*  HCT  37.0*   < >  30.4*  36.7*  40.0  38.0*  PLT  240   --   274  315   --    --    < > = values in this interval not displayed.   Coag's Recent Labs     05/09/13  0220  05/09/13  0359  05/09/13  0433  APTT  28  29  83*  INR   --   1.20  2.21*   BMET Recent Labs     05/09/13  0220   05/09/13  0402  05/09/13  0433  05/09/13  0600  05/09/13  0824  NA  134*   < >  138  133*  139  140  K  2.4*   < >  3.2*  2.8*  3.1*  3.8  CL  93*   < >  101  98  105  110  CO2  19   --   16*  16*   --    --  BUN  34*   < >  33*  33*  29*  29*  CREATININE  1.66*   < >  1.44*  1.22  1.40*  1.30  GLUCOSE  523*   < >  546*  601*  608*  549*   < > = values in this interval not displayed.   Electrolytes Recent Labs     05/09/13  0220  05/09/13  0402  05/09/13  0433  CALCIUM  8.5  6.6*  7.2*  MG   --    --   2.0   Sepsis Markers No results found for this basename: LACTICACIDVEN, PROCALCITON, O2SATVEN,  in the last 72 hours ABG Recent Labs     05/09/13  0500  PHART  7.236*  PCO2ART  40.0  PO2ART  75.2*   Liver Enzymes Recent Labs     05/09/13  0220  05/09/13  0402  AST  195*  226*  ALT  204*  225*  ALKPHOS  75  59  BILITOT  0.1*  0.1*  ALBUMIN  3.1*  2.3*   Cardiac Enzymes Recent Labs     05/09/13  0220  05/09/13  0402  05/09/13  0508  TROPONINI  1.17*  2.13*  >20.00*  PROBNP  59.6   --    --    Glucose Recent Labs     05/09/13  0536  05/09/13  0643  05/09/13  0754  05/09/13  0858  GLUCAP  554*  531*  507*  407*    Imaging Dg Chest Port 1 View  05/09/2013   *RADIOLOGY REPORT*  Clinical Data: Post central line placement  PORTABLE CHEST - 1 VIEW  Comparison: 05/09/2013  Findings: Left IJ catheter tip projects over the cavoatrial junction.  No pneumothorax. Endotracheal tube tip projects 3.6  cm proximal to the carina. The NG tube has been advanced, with side port projecting over the left upper quadrant.  The tip of the catheter descends below the level of the image.  Support pad again obscures much of the left hemithorax.  Mild hazy right lung opacity suggests mild atelectasis.  IMPRESSION: Left IJ catheter tip projects over the cavoatrial junction.  No pneumothorax.  Mild right lung opacity, favor atelectasis.   Original Report Authenticated By: Jearld Lesch, M.D.   Dg Chest Port 1 View  05/09/2013   *RADIOLOGY REPORT*  Clinical Data: Post intubation  PORTABLE CHEST - 1 VIEW  Comparison: None.  Findings: Endotracheal tube tip projects 2.7 cm proximal to the carina.  NG tube descends over the proximal stomach with the side port in the esophagus.  Support devices obscure much of the left hemithorax.  No overt consolidation, pleural effusion, or pneumothorax.  No acute osseous finding.  There is a large amount of gas within the left upper quadrant, presumably within stomach.  IMPRESSION: Endotracheal tube tip projects 2.7 cm proximal to the carina.  Large amount of gas within the left upper quadrant, presumably within stomach. Correlate clinically and attention on follow-up.  The NG side port is in the esophagus.  Therefore, recommend the tube be advanced 5-10 cm.   Original Report Authenticated By: Jearld Lesch, M.D.     CXR:  No acute parenchymal infiltrates.   ASSESSMENT / PLAN:  PULMONARY A: 1) Acute hypoxemic respiratory failure secondary to V fib arrest, combined met / resp  Acidosis Hypothermia protocol hypoxia P:   - abg now as was prior acidosis but now under hypothermia -rate to 22,  abg in 20 min - Albuterol PRN -pao2 only 75, peep to 10 then recruit to reduce fio2 to goal 60%  CARDIOVASCULAR A:  1) V fib arrest 2) Inferolateral STEMI, stent rca 3)relative shock P:  -asa -levophed to MAP 80 under hypothermia -per cards -pcxr in am for edema -cortisol    RENAL A:   1) Acute renal failure, likely secondary to shock 2) Hypokalemia P:   - K supp with "cold diuresis" -bmet q4h on protocol -avoid free water for now with risk brain edema  GASTROINTESTINAL A:   1) No issues P:   - GI prophylaxis with protonix -lft in am  -likely feed in am   HEMATOLOGIC A: DVt prevention  1) add sub q heparin Cbc in am   INFECTIOUS A:   1) Hypothermia, at risk PNA,. sepsis P:   - No antibiotics, but low suspicion -pcxr in am , no vomit noted  ENDOCRINE A:   1) No known history of DM  P:   - Insulin drip -cortisol  NEUROLOGIC A:   1) Unresponsive, likely anoxic brain injury P:   - Hypothermia protocol at goal - Paralysis and sedation per protocol.   TODAY'S SUMMARY: Increase peep, increase rate, abg to follow, hold TF, k supp, family updated  I have personally obtained a history, examined the patient, evaluated laboratory and imaging results, formulated the assessment and plan and placed orders. CRITICAL CARE: The patient is critically ill with multiple organ systems failure and requires high complexity decision making for assessment and support, frequent evaluation and titration of therapies, application of advanced monitoring technologies and extensive interpretation of multiple databases. Critical Care Time devoted to patient care services described in this note is 60 minutes.   [name]. [name], MD, FACP Pgr: [phone] [facility]

## 2013-05-09 NOTE — ED Notes (Signed)
Critical troponin and potassium results shown to Dr. Gardiner Rhyme. RN Minerva Areola notified as well

## 2013-05-09 NOTE — ED Notes (Signed)
MD Harwani at bedside, requested patient be moved to Cath Lab at this time.

## 2013-05-09 NOTE — Progress Notes (Signed)
Patient transported on vent to Cath Lab without incident at 0250.

## 2013-05-09 NOTE — Consult Note (Signed)
Reason for Consult: Acute inferolateral wall MI V. fib cardiac arrest Referring Physician: CCM  Dustin Vang is an 62 y.o. male.  HPI: Patient is 62 year old male with past medical history significant for hypertension, hypercholesteremia, remote history of tobacco abuse, complaint of retrosternal chest pain radiating to both arms associated with mild shortness of breath patient controlled with family to the ER prior to reaching ER patient's chest pain resolved and went back home again restarted severe chest pain radiating to both arms associated with shortness of breath and suddenly collapsed CPR was initiated by family member EMS was called patient was noted to be in V. fib requiring multiple shocks and epinephrine her in the ER patient was intubated and her EKG done in the ER showed sinus tach with more than 10 mm ST elevation in inferolateral leads with reciprocal changes in leads 1 aVL and ST depression in lead V1 V2 suggestive of acute and 4 posterolateral wall MI. Patient presently intubated in the ED on the IV amiodarone, Versed and fentanyl drip. Patient was emergently taken to the Cath Lab and was noted to have blood pressure and 60 systolic and heart rate in low 40s. Sedated and unresponsive.  No past medical history on file.  No past surgical history on file.  No family history on file.  Social History:  has no tobacco, alcohol, and drug history on file.  Allergies: Allergies not on file  Medications: I have reviewed the patient's current medications.  Results for orders placed during the hospital encounter of 05/09/13 (from the past 48 hour(s))  TROPONIN I     Status: Abnormal   Collection Time    05/09/13  2:20 AM      Result Value Range   Troponin I 1.17 (*) <0.30 ng/mL   Comment:            Due to the release kinetics of cTnI,     a negative result within the first hours     of the onset of symptoms does not rule out     myocardial infarction with certainty.     If  myocardial infarction is still suspected,     repeat the test at appropriate intervals.     REPEATED TO VERIFY     CRITICAL RESULT CALLED TO, READ BACK BY AND VERIFIED WITH:     ROBERTS J,RN 05/09/13 0319 WAYK  COMPREHENSIVE METABOLIC PANEL     Status: Abnormal   Collection Time    05/09/13  2:20 AM      Result Value Range   Sodium 134 (*) 135 - 145 mEq/L   Potassium 2.4 (*) 3.5 - 5.1 mEq/L   Comment: CRITICAL RESULT CALLED TO, READ BACK BY AND VERIFIED WITH:     ROBERTS J,RN 05/09/13 0319 WAYK   Chloride 93 (*) 96 - 112 mEq/L   CO2 19  19 - 32 mEq/L   Glucose, Bld 523 (*) 70 - 99 mg/dL   BUN 34 (*) 6 - 23 mg/dL   Creatinine, Ser 1.61 (*) 0.50 - 1.35 mg/dL   Calcium 8.5  8.4 - 09.6 mg/dL   Total Protein 6.5  6.0 - 8.3 g/dL   Albumin 3.1 (*) 3.5 - 5.2 g/dL   AST 045 (*) 0 - 37 U/L   ALT 204 (*) 0 - 53 U/L   Alkaline Phosphatase 75  39 - 117 U/L   Total Bilirubin 0.1 (*) 0.3 - 1.2 mg/dL   GFR calc non Af Amer 45 (*) >90  mL/min   GFR calc Af Amer 52 (*) >90 mL/min   Comment: (NOTE)     The eGFR has been calculated using the CKD EPI equation.     This calculation has not been validated in all clinical situations.     eGFR's persistently <90 mL/min signify possible Chronic Kidney     Disease.  CBC WITH DIFFERENTIAL     Status: Abnormal   Collection Time    05/09/13  2:20 AM      Result Value Range   WBC 20.3 (*) 4.0 - 10.5 K/uL   RBC 4.07 (*) 4.22 - 5.81 MIL/uL   Hemoglobin 12.5 (*) 13.0 - 17.0 g/dL   HCT 29.5 (*) 62.1 - 30.8 %   MCV 90.9  78.0 - 100.0 fL   MCH 30.7  26.0 - 34.0 pg   MCHC 33.8  30.0 - 36.0 g/dL   RDW 65.7  84.6 - 96.2 %   Platelets 240  150 - 400 K/uL   Neutrophils Relative % 56  43 - 77 %   Lymphocytes Relative 35  12 - 46 %   Monocytes Relative 5  3 - 12 %   Eosinophils Relative 4  0 - 5 %   Basophils Relative 0  0 - 1 %   Neutro Abs 11.4 (*) 1.7 - 7.7 K/uL   Lymphs Abs 7.1 (*) 0.7 - 4.0 K/uL   Monocytes Absolute 1.0  0.1 - 1.0 K/uL   Eosinophils  Absolute 0.8 (*) 0.0 - 0.7 K/uL   Basophils Absolute 0.0  0.0 - 0.1 K/uL  APTT     Status: None   Collection Time    05/09/13  2:20 AM      Result Value Range   aPTT 28  24 - 37 seconds  PRO B NATRIURETIC PEPTIDE     Status: None   Collection Time    05/09/13  2:20 AM      Result Value Range   Pro B Natriuretic peptide (BNP) 59.6  0 - 125 pg/mL  POCT I-STAT TROPONIN I     Status: Abnormal   Collection Time    05/09/13  2:20 AM      Result Value Range   Troponin i, poc 0.37 (*) 0.00 - 0.08 ng/mL   Comment NOTIFIED PHYSICIAN     Comment 3            Comment: Due to the release kinetics of cTnI,     a negative result within the first hours     of the onset of symptoms does not rule out     myocardial infarction with certainty.     If myocardial infarction is still suspected,     repeat the test at appropriate intervals.  POCT I-STAT, CHEM 8     Status: Abnormal   Collection Time    05/09/13  2:22 AM      Result Value Range   Sodium 137  135 - 145 mEq/L   Potassium 2.7 (*) 3.5 - 5.1 mEq/L   Chloride 99  96 - 112 mEq/L   BUN 38 (*) 6 - 23 mg/dL   Creatinine, Ser 9.52 (*) 0.50 - 1.35 mg/dL   Glucose, Bld 841 (*) 70 - 99 mg/dL   Calcium, Ion 3.24 (*) 1.12 - 1.23 mmol/L   TCO2 21  0 - 100 mmol/L   Hemoglobin 12.9 (*) 13.0 - 17.0 g/dL   HCT 40.1 (*) 02.7 - 25.3 %   Comment  NOTIFIED PHYSICIAN    CG4 I-STAT (LACTIC ACID)     Status: Abnormal   Collection Time    05/09/13  2:22 AM      Result Value Range   Lactic Acid, Venous 10.35 (*) 0.5 - 2.2 mmol/L  PROTIME-INR     Status: None   Collection Time    05/09/13  3:59 AM      Result Value Range   Prothrombin Time 14.9  11.6 - 15.2 seconds   INR 1.20  0.00 - 1.49  APTT     Status: None   Collection Time    05/09/13  3:59 AM      Result Value Range   aPTT 29  24 - 37 seconds  CBC     Status: Abnormal   Collection Time    05/09/13  3:59 AM      Result Value Range   WBC 18.7 (*) 4.0 - 10.5 K/uL   RBC 3.38 (*) 4.22 - 5.81  MIL/uL   Hemoglobin 10.3 (*) 13.0 - 17.0 g/dL   Comment: REPEATED TO VERIFY     DELTA CHECK NOTED   HCT 30.4 (*) 39.0 - 52.0 %   MCV 89.9  78.0 - 100.0 fL   MCH 30.5  26.0 - 34.0 pg   MCHC 33.9  30.0 - 36.0 g/dL   RDW 16.1  09.6 - 04.5 %   Platelets 274  150 - 400 K/uL  LIPID PANEL     Status: Abnormal   Collection Time    05/09/13  4:01 AM      Result Value Range   Cholesterol 138  0 - 200 mg/dL   Triglycerides 409 (*) <150 mg/dL   HDL 17 (*) >81 mg/dL   Total CHOL/HDL Ratio 8.1     VLDL 53 (*) 0 - 40 mg/dL   LDL Cholesterol 68  0 - 99 mg/dL   Comment:            Total Cholesterol/HDL:CHD Risk     Coronary Heart Disease Risk Table                         Men   Women      1/2 Average Risk   3.4   3.3      Average Risk       5.0   4.4      2 X Average Risk   9.6   7.1      3 X Average Risk  23.4   11.0                Use the calculated Patient Ratio     above and the CHD Risk Table     to determine the patient's CHD Risk.                ATP III CLASSIFICATION (LDL):      <100     mg/dL   Optimal      191-478  mg/dL   Near or Above                        Optimal      130-159  mg/dL   Borderline      295-621  mg/dL   High      >308     mg/dL   Very High  CK TOTAL AND CKMB     Status: Abnormal  Collection Time    05/09/13  4:02 AM      Result Value Range   Total CK 280 (*) 7 - 232 U/L   CK, MB 16.1 (*) 0.3 - 4.0 ng/mL   Comment: CRITICAL RESULT CALLED TO, READ BACK BY AND VERIFIED WITH:     THOMPSON J,RN 05/09/13 0448 WAYK   Relative Index 5.8 (*) 0.0 - 2.5  COMPREHENSIVE METABOLIC PANEL     Status: Abnormal   Collection Time    05/09/13  4:02 AM      Result Value Range   Sodium 138  135 - 145 mEq/L   Potassium 3.2 (*) 3.5 - 5.1 mEq/L   Chloride 101  96 - 112 mEq/L   CO2 16 (*) 19 - 32 mEq/L   Glucose, Bld 546 (*) 70 - 99 mg/dL   BUN 33 (*) 6 - 23 mg/dL   Creatinine, Ser 1.61 (*) 0.50 - 1.35 mg/dL   Calcium 6.6 (*) 8.4 - 10.5 mg/dL   Total Protein 4.6 (*) 6.0  - 8.3 g/dL   Albumin 2.3 (*) 3.5 - 5.2 g/dL   AST 096 (*) 0 - 37 U/L   ALT 225 (*) 0 - 53 U/L   Alkaline Phosphatase 59  39 - 117 U/L   Total Bilirubin 0.1 (*) 0.3 - 1.2 mg/dL   GFR calc non Af Amer 53 (*) >90 mL/min   GFR calc Af Amer 61 (*) >90 mL/min   Comment: (NOTE)     The eGFR has been calculated using the CKD EPI equation.     This calculation has not been validated in all clinical situations.     eGFR's persistently <90 mL/min signify possible Chronic Kidney     Disease.  TROPONIN I     Status: Abnormal   Collection Time    05/09/13  4:02 AM      Result Value Range   Troponin I 2.13 (*) <0.30 ng/mL   Comment:            Due to the release kinetics of cTnI,     a negative result within the first hours     of the onset of symptoms does not rule out     myocardial infarction with certainty.     If myocardial infarction is still suspected,     repeat the test at appropriate intervals.     REPEATED TO VERIFY     CRITICAL RESULT CALLED TO, READ BACK BY AND VERIFIED WITH:     THOMPSON J,RN 05/09/13 0448 [facility]    Dg Chest Port 1 View  05/09/2013   *RADIOLOGY REPORT*  Clinical Data: Post intubation  PORTABLE CHEST - 1 VIEW  Comparison: None.  Findings: Endotracheal tube tip projects 2.7 cm proximal to the carina.  NG tube descends over the proximal stomach with the side port in the esophagus.  Support devices obscure much of the left hemithorax.  No overt consolidation, pleural effusion, or pneumothorax.  No acute osseous finding.  There is a large amount of gas within the left upper quadrant, presumably within stomach.  IMPRESSION: Endotracheal tube tip projects 2.7 cm proximal to the carina.  Large amount of gas within the left upper quadrant, presumably within stomach. Correlate clinically and attention on follow-up.  The NG side port is in the esophagus.  Therefore, recommend the tube be advanced 5-10 cm.   Original Report Authenticated By: Jearld Lesch, M.D.    Review of  Systems  Unable to  perform ROS: patient unresponsive   Blood pressure 107/72, pulse 99, temperature 97.5 F (36.4 C), temperature source Axillary, resp. rate 20, weight 55 kg (121 lb 4.1 oz), SpO2 100.00%. Physical Exam  Eyes: Conjunctivae are normal. Left eye exhibits no discharge. No scleral icterus.  Neck: Normal range of motion. Neck supple. No JVD present. No tracheal deviation present.  Cardiovascular:  Bradycardic S1-S2 soft  Respiratory:  Clear to auscultation anterolaterally  GI: Soft. Bowel sounds are normal. He exhibits no distension.  Musculoskeletal: He exhibits no edema and no tenderness.    Assessment/Plan: Acute inferoposterolateral wall myocardial infarction Status post V. fib cardiac arrest Acute respiratory failure Cardiogenic shock Hypertension New-onset diabetes mellitus Cholesteremia Remote tobacco abuse Plan Discuss with family briefly regarding emergency left cath possible PTCA stenting its risk and benefits i.e. death MI stroke need for emergency CABG local vascular complications etc. and consented for PCI  [facility] N 05/09/2013, 4:52 AM

## 2013-05-09 NOTE — Progress Notes (Signed)
Subjective:  Patient remains intubated sedated, on paralytics unresponsive. On hypothermia protocol. Patient underwent emergency PCI 100% large occluded RCA with excellent results  Objective:  Vital Signs in the last 24 hours: Temp:  [90.9 F (32.7 C)-97.5 F (36.4 C)] 90.9 F (32.7 C) (08/25 1200) Pulse Rate:  [75-116] 75 (08/25 1114) Resp:  [15-37] 30 (08/25 1114) BP: (103-163)/(72-96) 103/73 mmHg (08/25 1114) SpO2:  [93 %-100 %] 100 % (08/25 1114) Arterial Line BP: (98-125)/(71-86) 114/83 mmHg (08/25 0713) FiO2 (%):  [100 %] 100 % (08/25 1114) Weight:  [55 kg (121 lb 4.1 oz)-69.4 kg (153 lb)] 69.4 kg (153 lb) (08/25 0543)  Intake/Output from previous day: 08/24 0701 - 08/25 0700 In: 100 [IV Piggyback:100] Out: 1700 [Urine:1700] Intake/Output from this shift: Total I/O In: 1238.8 [I.V.:1178.8; NG/GT:60] Out: 925 [Urine:925]  Physical Exam: Neck: no adenopathy, no carotid bruit, no JVD and supple, symmetrical, trachea midline Lungs: Clear to auscultation anterolaterally Heart: regular rate and rhythm and S1, S2 normal Abdomen: soft, non-tender; bowel sounds normal; no masses,  no organomegaly Extremities: extremities normal, atraumatic, no cyanosis or edema  Lab Results:  Recent Labs  05/09/13 0433  05/09/13 0824 05/09/13 1033  WBC 13.4*  --   --  9.2  HGB 12.8*  < > 12.9* 12.7*  PLT 315  --   --  325  < > = values in this interval not displayed.  Recent Labs  05/09/13 0402 05/09/13 0433 05/09/13 0600 05/09/13 0824  NA 138 133* 139 140  K 3.2* 2.8* 3.1* 3.8  CL 101 98 105 110  CO2 16* 16*  --   --   GLUCOSE 546* 601* 608* 549*  BUN 33* 33* 29* 29*  CREATININE 1.44* 1.22 1.40* 1.30    Recent Labs  05/09/13 0402 05/09/13 0508  TROPONINI 2.13* >20.00*   Hepatic Function Panel  Recent Labs  05/09/13 0402  PROT 4.6*  ALBUMIN 2.3*  AST 226*  ALT 225*  ALKPHOS 59  BILITOT 0.1*    Recent Labs  05/09/13 0530  CHOL 196   No results found for  this basename: PROTIME,  in the last 72 hours  Imaging: Imaging results have been reviewed and Dg Chest Port 1 View  05/09/2013   *RADIOLOGY REPORT*  Clinical Data: Post central line placement  PORTABLE CHEST - 1 VIEW  Comparison: 05/09/2013  Findings: Left IJ catheter tip projects over the cavoatrial junction.  No pneumothorax. Endotracheal tube tip projects 3.6 cm proximal to the carina. The NG tube has been advanced, with side port projecting over the left upper quadrant.  The tip of the catheter descends below the level of the image.  Support pad again obscures much of the left hemithorax.  Mild hazy right lung opacity suggests mild atelectasis.  IMPRESSION: Left IJ catheter tip projects over the cavoatrial junction.  No pneumothorax.  Mild right lung opacity, favor atelectasis.   Original Report Authenticated By: Jearld Lesch, M.D.   Dg Chest Port 1 View  05/09/2013   *RADIOLOGY REPORT*  Clinical Data: Post intubation  PORTABLE CHEST - 1 VIEW  Comparison: None.  Findings: Endotracheal tube tip projects 2.7 cm proximal to the carina.  NG tube descends over the proximal stomach with the side port in the esophagus.  Support devices obscure much of the left hemithorax.  No overt consolidation, pleural effusion, or pneumothorax.  No acute osseous finding.  There is a large amount of gas within the left upper quadrant, presumably within stomach.  IMPRESSION:  Endotracheal tube tip projects 2.7 cm proximal to the carina.  Large amount of gas within the left upper quadrant, presumably within stomach. Correlate clinically and attention on follow-up.  The NG side port is in the esophagus.  Therefore, recommend the tube be advanced 5-10 cm.   Original Report Authenticated By: Jearld Lesch, M.D.    Cardiac Studies:  Assessment/Plan:  Acute inferoposterolateral wall myocardial infarction status post PCI 100% occluded large RCA with excellent results Status post V. fib cardiac arrest  Acute respiratory  failure secondary to above  Cardiogenic shock  Hypertension  New-onset diabetes mellitus  Hypercholesteremia Remote tobacco abuse Probable anoxic encephalopathy Mild renal insufficiency improved Status post hypokalemia Plan Continue present management per CCM Wean off inotropes as blood pressure tolerates DC a line and venous sheath from the right groin Check labs in a.m.  LOS: 0 days    Sarrinah Gardin N 05/09/2013, 12:09 PM

## 2013-05-09 NOTE — Progress Notes (Signed)
Chaplain responded to Code Stemi in the ED. Patient is being  accessed by the medical staff, Chaplain contact not available at this time.  Large family gathering, with great emotional distress at patients medical status.  Acted as Print production planner  between for family and medical staff with escorting family room for medical update, and later escorted to [facility] waiting area.  Informed staff of family's presence and emotional state.  Offered encouragement, and comfort measures. Chaplain will continue to follow-up with family for spiritual support. Chaplain Janell Quiet [phone] mins) [phone]

## 2013-05-09 NOTE — CV Procedure (Signed)
Left cardiac cath/PTCA stenting report dictated on 05/09/2013 dictation number is [id number]

## 2013-05-09 NOTE — H&P (Signed)
PULMONARY  / CRITICAL CARE MEDICINE  Name: Dustin Vang MRN: [id number] DOB: 01/20/1956    ADMISSION DATE:  05/09/2013  PRIMARY SERVICE: PCCM  CHIEF COMPLAINT:  Cardiac arrest, STEMI  BRIEF PATIENT DESCRIPTION:  62 years old male admitted after cardiac arrest at home. Initial rhythm at EMS arrival was V fib. Down time about 15 minutes. Return to spontaneous circulation after multiple shocks, epinephrine and amiodarone. EKG with inferolateral STEMI.  SIGNIFICANT EVENTS / STUDIES:  EKG: Inferolateral STEMI  LINES / TUBES: - Peripheral lines - IO  CULTURES: No cultures  ANTIBIOTICS: No antibiotics  HISTORY OF PRESENT ILLNESS:   62 years old male, non english speaking. Sustained cardiac arrest at home. This afternoon complained of SOB, went to the ED but felt better and left without being evaluated. Last seen well at 1:05 am, then found unresponsive. EMS arrived, initial rhythm was V. Fib. Required multiple shocks, epinephrine and amiodarone before return to spontaneous circulation. Down time of about 15 minutes. At the time of my exam, intubated, unresponsive, hypotensive. EKG with inferolateral STEMI. Cardiology called. Patient will go to the cath lab. Hypothermia protocol started.   PAST MEDICAL HISTORY :  No past medical history on file. No past surgical history on file. Prior to Admission medications   Not on File   Allergies not on file  FAMILY HISTORY:  No family history on file. SOCIAL HISTORY:  has no tobacco, alcohol, and drug history on file.  REVIEW OF SYSTEMS:  Unable to provide.  SUBJECTIVE:   VITAL SIGNS: Pulse Rate:  [99-105] 99 (08/25 0240) Resp:  [15-37] 20 (08/25 0240) BP: (107-110)/(72-96) 107/72 mmHg (08/25 0240) SpO2:  [100 %] 100 % (08/25 0240) FiO2 (%):  [100 %] 100 % (08/25 0217) Weight:  [121 lb 4.1 oz (55 kg)] 121 lb 4.1 oz (55 kg) (08/25 0220) HEMODYNAMICS:   VENTILATOR SETTINGS: Vent Mode:  [-] PRVC FiO2 (%):  [100 %] 100 % Set Rate:   [14 bmp] 14 bmp Vt Set:  [500 mL] 500 mL PEEP:  [5 cmH20] 5 cmH20 INTAKE / OUTPUT: Intake/Output   None     PHYSICAL EXAMINATION: General: Intubated, unresponsive, no apparent distress. Eyes: Anicteric sclerae. ENT: ETT in place, trachea at midline. Lymph: No cervical, supraclavicular, or axillary lymphadenopathy. Heart: Normal S1, S2. No murmurs, rubs, or gallops appreciated. No bruits, equal pulses. JVD present. Lungs: Normal excursion, no dullness to percussion. Good air movement bilaterally, without wheezes or crackles. Normal upper airway sounds without evidence of stridor. Abdomen: Abdomen soft, not distended, normoactive bowel sounds. No hepatosplenomegaly or masses. Musculoskeletal: No clubbing or synovitis. No edema. Skin: No rashes or lesions Neuro: Unresponsive. Has cough reflex.  LABS:  CBC Recent Labs     05/09/13  0220  05/09/13  0222  WBC  20.3*   --   HGB  12.5*  12.9*  HCT  37.0*  38.0*  PLT  240   --    Coag's Recent Labs     05/09/13  0220  APTT  28   BMET Recent Labs     05/09/13  0222  NA  137  K  2.7*  CL  99  BUN  38*  CREATININE  1.80*  GLUCOSE  521*   Electrolytes No results found for this basename: CALCIUM, MG, PHOS,  in the last 72 hours Sepsis Markers No results found for this basename: LACTICACIDVEN, PROCALCITON, O2SATVEN,  in the last 72 hours ABG No results found for this basename: PHART, PCO2ART, PO2ART,  in the last 72 hours Liver Enzymes No results found for this basename: AST, ALT, ALKPHOS, BILITOT, ALBUMIN,  in the last 72 hours Cardiac Enzymes No results found for this basename: TROPONINI, PROBNP,  in the last 72 hours Glucose No results found for this basename: GLUCAP,  in the last 72 hours  Imaging Dg Chest Port 1 View  05/09/2013   *RADIOLOGY REPORT*  Clinical Data: Post intubation  PORTABLE CHEST - 1 VIEW  Comparison: None.  Findings: Endotracheal tube tip projects 2.7 cm proximal to the carina.  NG tube descends  over the proximal stomach with the side port in the esophagus.  Support devices obscure much of the left hemithorax.  No overt consolidation, pleural effusion, or pneumothorax.  No acute osseous finding.  There is a large amount of gas within the left upper quadrant, presumably within stomach.  IMPRESSION: Endotracheal tube tip projects 2.7 cm proximal to the carina.  Large amount of gas within the left upper quadrant, presumably within stomach. Correlate clinically and attention on follow-up.  The NG side port is in the esophagus.  Therefore, recommend the tube be advanced 5-10 cm.   Original Report Authenticated By: Jearld Lesch, M.D.     CXR:  No acute parenchymal infiltrates.   ASSESSMENT / PLAN:  PULMONARY A: 1) Acute hypoxemic respiratory failure secondary to V fib arrest.  P:   - Will admit to cardiac ICU - Mechanical ventilation   - PRVC, Vt: 8cc/kg, PEEP: 5, RR: 24, FiO2: 100% and adjust to keep O2 sat > 94% - VAP prevention order set - Albuterol PRN  CARDIOVASCULAR A:  1) V fib arrest 2) Inferolateral STEMI P:  - Will go to the cath lab STAT - Hypothermia protocol started. - Cardiology input appreciated.    RENAL A:   1) Acute renal failure, likely secondary to shock 2) Hypokalemia P:   - K ordered in the ED - Will follow BMP  GASTROINTESTINAL A:   1) No issues P:   - GI prophylaxis with protonix  HEMATOLOGIC A:   1) No issues   INFECTIOUS A:   1) No evidence of acute infection. Likely a primary cardiac event.  P:   - No antibiotics  ENDOCRINE A:   1) No known history of DM  P:   - POCT glucose q 8hrs  NEUROLOGIC A:   1) Unresponsive, likely anoxic brain injury P:   - Hypothermia protocol started - Paralysis and sedation per protocol.   TODAY'S SUMMARY:   I have personally obtained a history, examined the patient, evaluated laboratory and imaging results, formulated the assessment and plan and placed orders. CRITICAL CARE: The  patient is critically ill with multiple organ systems failure and requires high complexity decision making for assessment and support, frequent evaluation and titration of therapies, application of advanced monitoring technologies and extensive interpretation of multiple databases. Critical Care Time devoted to patient care services described in this note is 60 minutes.   Overton Mam, MD Pulmonary and Critical Care Medicine [facility] Pager: [phone]  05/09/2013, 3:02 AM

## 2013-05-09 NOTE — ED Provider Notes (Addendum)
CSN: [id number]     Arrival date & time 05/09/13  0202 History     First MD Initiated Contact with Patient 05/09/13 [phone]     Chief Complaint  Patient presents with  . Cardiac Arrest   (Consider location/radiation/quality/duration/timing/severity/associated sxs/prior Treatment) HPI Comments: 62 yo male with no known medical hx, no known heart hx, unable to obtain hx due to acuity of presentation/ AMS presents after cardiac arrest.  Pt was seen last normal 30 min prior to EMS arrival.  Pt was feeling sob earlier today, came to the hospital per report but sxs resolved so went home without being seen.  No family in ED.  Pt found unresponsive, no pulse.  CPR and multiple epi and multiple defibrillations prior to arrival with return of pulse and altered mental.  Pt does not speak english per report.   The history is provided by the EMS personnel.    No past medical history on file. No past surgical history on file. No family history on file. History  Substance Use Topics  . Smoking status: Not on file  . Smokeless tobacco: Not on file  . Alcohol Use: Not on file    Review of Systems  Unable to perform ROS: Patient unresponsive    Allergies  Review of patient's allergies indicates not on file.  Home Medications  No current outpatient prescriptions on file. BP 122/85  Pulse 113  Temp(Src) 91.2 F (32.9 C) (Core (Comment))  Resp 18  Ht 5\' 1"  (1.549 m)  Wt 153 lb (69.4 kg)  BMI 28.92 kg/m2  SpO2 95% Physical Exam  Nursing note and vitals reviewed. Constitutional: He appears well-developed and well-nourished. He appears distressed.  HENT:  Head: Normocephalic and atraumatic.  Eyes: Right eye exhibits no discharge. Left eye exhibits no discharge.  Mild injected sclera  Neck: Neck supple. No JVD present. No tracheal deviation present.  Cardiovascular: Regular rhythm.  Tachycardia present.   Pulmonary/Chest: He is in respiratory distress. He has no wheezes. He has rales (few  bilateral).  Abdominal: Soft. He exhibits no distension. There is no tenderness. There is no guarding.  Musculoskeletal: He exhibits no edema.  Neurological: GCS eye subscore is 2. GCS verbal subscore is 2. GCS motor subscore is 4.  Agonal respirations, few moans on arrival Non verbal perrl Neck supple Mildly moves all ext   Skin: Skin is warm. No rash noted. He is diaphoretic.  Psychiatric:  Unable to assess    ED Course   CARDIOVERSION Performed by: Enid Skeens Authorized by: Enid Skeens Consent: The procedure was performed in an emergent situation. Patient identity confirmed: arm band Patient sedated: no Cardioversion basis: emergent Pre-procedure rhythm: ventricular tachycardia Patient position: patient was placed in a supine position Chest area: chest area exposed Electrodes: pads Electrodes placed: anterior-posterior Number of attempts: 1 Attempt 1 mode: synchronous Attempt 1 shock (in Joules): 120 Attempt 1 outcome: conversion to other rhythm Complications: no complications Comments: Converted to likely sinus tach/ bundle block vs v tach   (including critical care time) CRITICAL CARE Performed by: Enid Skeens  Total critical care time: 35 min  Critical care time was exclusive of separately billable procedures and treating other patients. Critical care was necessary to treat or prevent imminent or life-threatening deterioration.  Critical care was time spent personally by me on the following activities: development of treatment plan with patient and/or surrogate as well as nursing, discussions with consultants, evaluation of patient's response to treatment, examination of patient, obtaining  history from patient or surrogate, ordering and performing treatments and interventions, ordering and review of laboratory studies, ordering and review of radiographic studies, pulse oximetry and re-evaluation of patient's condition.   Labs Reviewed  TROPONIN I  - Abnormal; Notable for the following:    Troponin I 1.17 (*)    All other components within normal limits  COMPREHENSIVE METABOLIC PANEL - Abnormal; Notable for the following:    Sodium 134 (*)    Potassium 2.4 (*)    Chloride 93 (*)    Glucose, Bld 523 (*)    BUN 34 (*)    Creatinine, Ser 1.66 (*)    Albumin 3.1 (*)    AST 195 (*)    ALT 204 (*)    Total Bilirubin 0.1 (*)    GFR calc non Af Amer 45 (*)    GFR calc Af Amer 52 (*)    All other components within normal limits  CBC WITH DIFFERENTIAL - Abnormal; Notable for the following:    WBC 20.3 (*)    RBC 4.07 (*)    Hemoglobin 12.5 (*)    HCT 37.0 (*)    Neutro Abs 11.4 (*)    Lymphs Abs 7.1 (*)    Eosinophils Absolute 0.8 (*)    All other components within normal limits  BLOOD GAS, ARTERIAL - Abnormal; Notable for the following:    pH, Arterial 7.236 (*)    pO2, Arterial 75.2 (*)    Bicarbonate 16.4 (*)    Acid-base deficit 9.7 (*)    All other components within normal limits  CBC - Abnormal; Notable for the following:    WBC 18.7 (*)    RBC 3.38 (*)    Hemoglobin 10.3 (*)    HCT 30.4 (*)    All other components within normal limits  LIPID PANEL - Abnormal; Notable for the following:    Triglycerides 263 (*)    HDL 17 (*)    VLDL 53 (*)    All other components within normal limits  CK TOTAL AND CKMB - Abnormal; Notable for the following:    Total CK 280 (*)    CK, MB 16.1 (*)    Relative Index 5.8 (*)    All other components within normal limits  COMPREHENSIVE METABOLIC PANEL - Abnormal; Notable for the following:    Potassium 3.2 (*)    CO2 16 (*)    Glucose, Bld 546 (*)    BUN 33 (*)    Creatinine, Ser 1.44 (*)    Calcium 6.6 (*)    Total Protein 4.6 (*)    Albumin 2.3 (*)    AST 226 (*)    ALT 225 (*)    Total Bilirubin 0.1 (*)    GFR calc non Af Amer 53 (*)    GFR calc Af Amer 61 (*)    All other components within normal limits  TROPONIN I - Abnormal; Notable for the following:    Troponin I  2.13 (*)    All other components within normal limits  POCT I-STAT, CHEM 8 - Abnormal; Notable for the following:    Potassium 2.7 (*)    BUN 38 (*)    Creatinine, Ser 1.80 (*)    Glucose, Bld 521 (*)    Calcium, Ion 1.09 (*)    Hemoglobin 12.9 (*)    HCT 38.0 (*)    All other components within normal limits  CG4 I-STAT (LACTIC ACID) - Abnormal; Notable for the following:  Lactic Acid, Venous 10.35 (*)    All other components within normal limits  POCT I-STAT TROPONIN I - Abnormal; Notable for the following:    Troponin i, poc 0.37 (*)    All other components within normal limits  POCT I-STAT, CHEM 8 - Abnormal; Notable for the following:    Potassium 3.1 (*)    BUN 29 (*)    Creatinine, Ser 1.40 (*)    Glucose, Bld 608 (*)    Calcium, Ion 0.98 (*)    All other components within normal limits  APTT  PRO B NATRIURETIC PEPTIDE  PROTIME-INR  APTT  BASIC METABOLIC PANEL  BASIC METABOLIC PANEL  BASIC METABOLIC PANEL  BASIC METABOLIC PANEL  BASIC METABOLIC PANEL  BASIC METABOLIC PANEL  BASIC METABOLIC PANEL  BASIC METABOLIC PANEL  BASIC METABOLIC PANEL  BASIC METABOLIC PANEL  PROTIME-INR  APTT  CBC  COMPREHENSIVE METABOLIC PANEL  HEMOGLOBIN A1C  HEMOGLOBIN A1C  CBC  TROPONIN I  TROPONIN I  TROPONIN I  PROTIME-INR  APTT  CBC WITH DIFFERENTIAL  MAGNESIUM  LIPID PANEL  CBC  LACTIC ACID, PLASMA   Dg Chest Port 1 View  05/09/2013   *RADIOLOGY REPORT*  Clinical Data: Post intubation  PORTABLE CHEST - 1 VIEW  Comparison: None.  Findings: Endotracheal tube tip projects 2.7 cm proximal to the carina.  NG tube descends over the proximal stomach with the side port in the esophagus.  Support devices obscure much of the left hemithorax.  No overt consolidation, pleural effusion, or pneumothorax.  No acute osseous finding.  There is a large amount of gas within the left upper quadrant, presumably within stomach.  IMPRESSION: Endotracheal tube tip projects 2.7 cm proximal to the  carina.  Large amount of gas within the left upper quadrant, presumably within stomach. Correlate clinically and attention on follow-up.  The NG side port is in the esophagus.  Therefore, recommend the tube be advanced 5-10 cm.   Original Report Authenticated By: Jearld Lesch, M.D.   No diagnosis found.  MDM  Patient presented post cardiac arrest with pulses regained after epi/ defibrillation. Pulse on arrival.  Bagging patient, pt started vomiting, suctioned multiple times.  Diaphoretic.   EKG reviewed at bedside, tachycardic, wide and inf/ lateral ST elevation, code STEMI called. Monitor strip reviewed, tachycardia 140s, wide qrs.   Date: 05/09/2013  Rate: 122  Rhythm: sinus tachycardia  QRS Axis: indeterminate  Intervals: QT prolonged and qrs wide  ST/T Wave abnormalities: ST elevations inferiorly, ST elevations laterally, ST depressions laterally and acute myocardial infarction  Conduction Disutrbances:nonspecific intraventricular conduction delay  Narrative Interpretation:   Old EKG Reviewed: none available  Pt intermittent v tach - defibrillated with 120 J in ED.  Rhythm improved temporarily. Pt intubated for airway protection and unstable, resp therapy attempted first then I personally intubated second attempt, CO2 detector color change, CXR. INTUBATION Performed by: Enid Skeens  Required items: required blood products, implants, devices, and special equipment available Patient identity confirmed: provided demographic data and hospital-assigned identification number Time out: Immediately prior to procedure a "time out" was called to verify the correct patient, procedure, equipment, support staff.  Indications: airway protection, distress Intubation method: direct Preoxygenation: BVM Sedatives: Etomidate Paralytic: Succinylcholine Tube Size: 7-0 cuffed  Post-procedure assessment: chest rise and ETCO2 monitor Breath sounds: equal and absent over the  epigastrium Tube secured with: ETT holder Chest x-ray interpreted by me.  Chest x-ray findings: endotracheal tube in appropriate position  Patient tolerated the procedure  well with no immediate complications.  EKG reviewed, CXR reviewed, et tube in place.  Cooling protocal started. Spoke with cardiology and intensivist, sent to Cath lab. Cardiac arrest, Acute STEMI inferior/ lateral, lactic and metabolic acidosis, hypokalemia, arf  Enid Skeens, MD 05/09/13 1610  Enid Skeens, MD 05/09/13 [phone]

## 2013-05-10 ENCOUNTER — Inpatient Hospital Stay (HOSPITAL_COMMUNITY): Payer: Medicaid Other

## 2013-05-10 DIAGNOSIS — I469 Cardiac arrest, cause unspecified: Secondary | ICD-10-CM

## 2013-05-10 DIAGNOSIS — E872 Acidosis: Secondary | ICD-10-CM

## 2013-05-10 DIAGNOSIS — N179 Acute kidney failure, unspecified: Secondary | ICD-10-CM

## 2013-05-10 DIAGNOSIS — E876 Hypokalemia: Secondary | ICD-10-CM

## 2013-05-10 LAB — CBC WITH DIFFERENTIAL/PLATELET
Hemoglobin: 12.3 g/dL — ABNORMAL LOW (ref 13.0–17.0)
Lymphocytes Relative: 13 % (ref 12–46)
Lymphs Abs: 0.9 10*3/uL (ref 0.7–4.0)
Monocytes Relative: 9 % (ref 3–12)
Neutro Abs: 5.4 10*3/uL (ref 1.7–7.7)
Neutrophils Relative %: 78 % — ABNORMAL HIGH (ref 43–77)
Platelets: 238 10*3/uL (ref 150–400)
RBC: 4.01 MIL/uL — ABNORMAL LOW (ref 4.22–5.81)
WBC: 6.9 10*3/uL (ref 4.0–10.5)

## 2013-05-10 LAB — BASIC METABOLIC PANEL
BUN: 23 mg/dL (ref 6–23)
CO2: 14 mEq/L — ABNORMAL LOW (ref 19–32)
Chloride: 112 mEq/L (ref 96–112)
Chloride: 113 mEq/L — ABNORMAL HIGH (ref 96–112)
Creatinine, Ser: 1 mg/dL (ref 0.50–1.35)
GFR calc non Af Amer: >90 mL/min (ref >90)
Glucose, Bld: 134 mg/dL — ABNORMAL HIGH (ref 70–99)
Glucose, Bld: 115 mg/dL — ABNORMAL HIGH (ref 70–99)
Potassium: 3.5 mEq/L (ref 3.5–5.1)
Potassium: 3.1 mEq/L — ABNORMAL LOW (ref 3.5–5.1)
Sodium: 140 mEq/L (ref 135–145)

## 2013-05-10 LAB — GLUCOSE, CAPILLARY
Glucose-Capillary: 102 mg/dL — ABNORMAL HIGH (ref 70–99)
Glucose-Capillary: 124 mg/dL — ABNORMAL HIGH (ref 70–99)
Glucose-Capillary: 131 mg/dL — ABNORMAL HIGH (ref 70–99)
Glucose-Capillary: 91 mg/dL (ref 70–99)
Glucose-Capillary: 90 mg/dL (ref 70–99)

## 2013-05-10 LAB — POCT I-STAT 3, ART BLOOD GAS (G3+)
Acid-base deficit: 10 mmol/L — ABNORMAL HIGH (ref 0.0–2.0)
Bicarbonate: 14.9 mEq/L — ABNORMAL LOW (ref 20.0–24.0)
O2 Saturation: 91 %
Patient temperature: 35
TCO2: 16 mmol/L (ref 0–100)
TCO2: 17 mmol/L (ref 0–100)
pCO2 arterial: 21 mmHg — ABNORMAL LOW (ref 35.0–45.0)
pCO2 arterial: 32.3 mmHg — ABNORMAL LOW (ref 35.0–45.0)
pH, Arterial: 7.441 (ref 7.350–7.450)

## 2013-05-10 MED ORDER — MIDAZOLAM HCL 2 MG/2ML IJ SOLN
INTRAMUSCULAR | Status: AC
  Administered 2013-05-10: 2 mg via INTRAVENOUS
  Filled 2013-05-10: qty 2

## 2013-05-10 MED ORDER — VITAL AF 1.2 CAL PO LIQD
1000.0000 mL | ORAL | Status: DC
  Filled 2013-05-10 (×2): qty 1000

## 2013-05-10 MED ORDER — PROPOFOL 10 MG/ML IV EMUL
5.0000 ug/kg/min | INTRAVENOUS | Status: DC
  Administered 2013-05-10: 40 ug/kg/min via INTRAVENOUS
  Administered 2013-05-10: 10 ug/kg/min via INTRAVENOUS
  Filled 2013-05-10 (×2): qty 100

## 2013-05-10 MED ORDER — FENTANYL CITRATE 0.05 MG/ML IJ SOLN
25.0000 ug | INTRAMUSCULAR | Status: DC | PRN
  Administered 2013-05-10: 100 ug via INTRAVENOUS
  Administered 2013-05-10: 50 ug via INTRAVENOUS
  Filled 2013-05-10 (×2): qty 2

## 2013-05-10 MED ORDER — SODIUM CHLORIDE 0.9 % IV SOLN
3.0000 g | Freq: Four times a day (QID) | INTRAVENOUS | Status: DC
  Administered 2013-05-10 – 2013-05-11 (×4): 3 g via INTRAVENOUS
  Filled 2013-05-10 (×7): qty 3

## 2013-05-10 MED ORDER — MIDAZOLAM HCL 2 MG/2ML IJ SOLN
5.0000 mg | INTRAMUSCULAR | Status: DC | PRN
  Administered 2013-05-10: 2 mg via INTRAVENOUS
  Filled 2013-05-10: qty 2

## 2013-05-10 MED ORDER — POTASSIUM CHLORIDE 20 MEQ/15ML (10%) PO LIQD
40.0000 meq | Freq: Once | ORAL | Status: AC
  Administered 2013-05-10: 40 meq

## 2013-05-10 MED ORDER — SODIUM CHLORIDE 0.9 % IV SOLN
INTRAVENOUS | Status: DC
  Administered 2013-05-11 – 2013-05-14 (×2): via INTRAVENOUS

## 2013-05-10 MED ORDER — DEXTROSE 10 % IV SOLN: INTRAVENOUS | Status: DC | PRN

## 2013-05-10 MED ORDER — MIDAZOLAM HCL 10 MG/2ML IJ SOLN: 5.0000 mg | INTRAMUSCULAR | Status: DC | PRN

## 2013-05-10 MED ORDER — INSULIN GLARGINE 100 UNIT/ML ~~LOC~~ SOLN
30.0000 [IU] | SUBCUTANEOUS | Status: DC
  Administered 2013-05-10 – 2013-05-11 (×2): 30 [IU] via SUBCUTANEOUS
  Filled 2013-05-10 (×2): qty 0.3

## 2013-05-10 MED ORDER — FENTANYL CITRATE 0.05 MG/ML IJ SOLN
25.0000 ug | INTRAMUSCULAR | Status: DC | PRN
  Administered 2013-05-11 (×3): 50 ug via INTRAVENOUS
  Filled 2013-05-10 (×3): qty 2

## 2013-05-10 MED ORDER — SODIUM CHLORIDE 0.9 % IV BOLUS (SEPSIS)
500.0000 mL | Freq: Once | INTRAVENOUS | Status: AC
  Administered 2013-05-10: 500 mL via INTRAVENOUS

## 2013-05-10 MED ORDER — VITAL AF 1.2 CAL PO LIQD
1000.0000 mL | ORAL | Status: DC
  Administered 2013-05-10 – 2013-05-14 (×5): 1000 mL
  Filled 2013-05-10 (×9): qty 1000

## 2013-05-10 MED ORDER — INSULIN ASPART 100 UNIT/ML ~~LOC~~ SOLN
2.0000 [IU] | SUBCUTANEOUS | Status: DC
  Administered 2013-05-10 – 2013-05-11 (×3): 2 [IU] via SUBCUTANEOUS

## 2013-05-10 MED ORDER — POTASSIUM CHLORIDE 20 MEQ/15ML (10%) PO LIQD
ORAL | Status: AC
  Filled 2013-05-10: qty 30

## 2013-05-10 MED ORDER — HYDROCORTISONE SOD SUCCINATE 100 MG IJ SOLR
50.0000 mg | Freq: Four times a day (QID) | INTRAMUSCULAR | Status: DC
  Administered 2013-05-10 – 2013-05-13 (×12): 50 mg via INTRAVENOUS
  Filled 2013-05-10 (×16): qty 1

## 2013-05-10 MED ORDER — SODIUM CHLORIDE 0.9 % IV SOLN
1.0000 ug/kg/min | INTRAVENOUS | Status: DC
  Filled 2013-05-10: qty 20

## 2013-05-10 MED ORDER — DEXMEDETOMIDINE HCL IN NACL 200 MCG/50ML IV SOLN
0.2000 ug/kg/h | INTRAVENOUS | Status: DC
  Administered 2013-05-10: 0.4 ug/kg/h via INTRAVENOUS
  Administered 2013-05-11 (×3): 1.2 ug/kg/h via INTRAVENOUS
  Filled 2013-05-10 (×3): qty 50

## 2013-05-10 MED ORDER — PROPOFOL 10 MG/ML IV EMUL
INTRAVENOUS | Status: AC
  Filled 2013-05-10: qty 100

## 2013-05-10 MED ORDER — MIDAZOLAM HCL 2 MG/2ML IJ SOLN
2.0000 mg | INTRAMUSCULAR | Status: DC | PRN
  Administered 2013-05-10: 2 mg via INTRAVENOUS
  Administered 2013-05-11: 4 mg via INTRAVENOUS
  Administered 2013-05-11 (×2): 2 mg via INTRAVENOUS
  Filled 2013-05-10: qty 2
  Filled 2013-05-10 (×2): qty 4
  Filled 2013-05-10 (×2): qty 2

## 2013-05-10 MED FILL — Sodium Chloride IV Soln 0.9%: INTRAVENOUS | Qty: 50 | Status: AC

## 2013-05-10 MED FILL — Potassium Chloride Inj 10 mEq/100ML: INTRAVENOUS | Qty: 100 | Status: AC

## 2013-05-10 NOTE — Progress Notes (Signed)
Name: Dustin Vang MRN: [id number] DOB: 01/20/1956  ELECTRONIC ICU PHYSICIAN NOTE  Problem:  Hypotensive p sedation with propofol   Intake/Output Summary (Last 24 hours) at 05/10/13 1929 Last data filed at 05/10/13 1911  Gross per 24 hour  Intake 983.38 ml  Output   1640 ml  Net -656.62 ml     CVP:  [8 mmHg-10 mmHg] 10 mmHg    Intervention: reduce propofol to min/ ns x 500 cc bolus, tirtrate levophed back up if needed  [name] 05/10/2013, 7:29 PM

## 2013-05-10 NOTE — Progress Notes (Signed)
Subjective:  Patient remains intubated sedated and on paralytics on hypothermia protocol  Objective:  Vital Signs in the last 24 hours: Temp:  [90.5 F (32.5 C)-93.2 F (34 C)] 93.2 F (34 C) (08/26 0800) Pulse Rate:  [60-83] 83 (08/26 0800) Resp:  [18-35] 35 (08/26 0800) BP: (100-131)/(69-87) 119/82 mmHg (08/26 0800) SpO2:  [90 %-100 %] 90 % (08/26 0800) FiO2 (%):  [40 %-100 %] 60 % (08/26 0800)  Intake/Output from previous day: 08/25 0701 - 08/26 0700 In: 3705.4 [I.V.:2615.4; NG/GT:230; IV Piggyback:860] Out: 2190 [Urine:2190] Intake/Output from this shift: Total I/O In: 20.7 [I.V.:20.7] Out: 15 [Urine:15]  Physical Exam: Neck: no adenopathy, no carotid bruit, no JVD and supple, symmetrical, trachea midline Lungs: Expiratory wheezing with occasional rhonchi noted Heart: regular rate and rhythm and S1, S2 normal Abdomen: Soft Extremities: extremities normal, atraumatic, no cyanosis or edema and Right groin stable with no evidence of hematoma or ecchymosis  Lab Results:  Recent Labs  05/09/13 1033  05/10/13 0409 05/10/13 0806  WBC 9.2  --  6.9  --   HGB 12.7*  < > 12.3* 11.6*  PLT 325  --  238  --   < > = values in this interval not displayed.  Recent Labs  05/10/13 0100 05/10/13 0409 05/10/13 0806  NA 139 140 144  K 3.5 3.1* 4.0  CL 112 113* 114*  CO2 14* 14*  --   GLUCOSE 134* 115* 100*  BUN 23 22 22   CREATININE 1.00 0.94 1.00    Recent Labs  05/09/13 1708 05/10/13 0409  TROPONINI >20.00* >20.00*   Hepatic Function Panel  Recent Labs  05/09/13 0402  PROT 4.6*  ALBUMIN 2.3*  AST 226*  ALT 225*  ALKPHOS 59  BILITOT 0.1*    Recent Labs  05/09/13 0530  CHOL 196   No results found for this basename: PROTIME,  in the last 72 hours  Imaging: Imaging results have been reviewed and Dg Chest Port 1 View  05/10/2013   *RADIOLOGY REPORT*  Clinical Data: Check endotracheal tube  PORTABLE CHEST - 1 VIEW  Comparison: Prior chest x-ray 08/08  09/19/2012  Findings: The endotracheal tube is 4 cm above the carina.  Stable position of left IJ approach central venous catheter with catheter tip in the distal SVC.  Nasogastric tube is present, the tip lies within the stomach.  External defibrillator pads project over the chest.  Slightly increased right greater than left pleural effusions. Stable cardiomegaly.  Background pulmonary vascular congestion is unchanged.  No pneumothorax.  No acute osseous abnormality.  IMPRESSION:  1.  Slightly increased right greater than left pleural effusions with associated bibasilar atelectasis. 2.  Stable and satisfactory support apparatus.   Original Report Authenticated By: Malachy Moan, M.D.   Dg Chest Port 1 View  05/09/2013   *RADIOLOGY REPORT*  Clinical Data: Post central line placement  PORTABLE CHEST - 1 VIEW  Comparison: 05/09/2013  Findings: Left IJ catheter tip projects over the cavoatrial junction.  No pneumothorax. Endotracheal tube tip projects 3.6 cm proximal to the carina. The NG tube has been advanced, with side port projecting over the left upper quadrant.  The tip of the catheter descends below the level of the image.  Support pad again obscures much of the left hemithorax.  Mild hazy right lung opacity suggests mild atelectasis.  IMPRESSION: Left IJ catheter tip projects over the cavoatrial junction.  No pneumothorax.  Mild right lung opacity, favor atelectasis.   Original Report Authenticated By: Greig Castilla  DelGaizo, M.D.   Dg Chest Port 1 View  05/09/2013   *RADIOLOGY REPORT*  Clinical Data: Post intubation  PORTABLE CHEST - 1 VIEW  Comparison: None.  Findings: Endotracheal tube tip projects 2.7 cm proximal to the carina.  NG tube descends over the proximal stomach with the side port in the esophagus.  Support devices obscure much of the left hemithorax.  No overt consolidation, pleural effusion, or pneumothorax.  No acute osseous finding.  There is a large amount of gas within the left upper  quadrant, presumably within stomach.  IMPRESSION: Endotracheal tube tip projects 2.7 cm proximal to the carina.  Large amount of gas within the left upper quadrant, presumably within stomach. Correlate clinically and attention on follow-up.  The NG side port is in the esophagus.  Therefore, recommend the tube be advanced 5-10 cm.   Original Report Authenticated By: Jearld Lesch, M.D.    Cardiac Studies:  Assessment/Plan:  Acute inferoposterolateral wall myocardial infarction status post PCI 100% occluded large RCA with excellent results  Status post V. fib cardiac arrest  Acute respiratory failure secondary to above rule out aspiration Status post Cardiogenic shock  Hypertension  New-onset diabetes mellitus  Hypercholesteremia  Remote tobacco abuse  Probable anoxic encephalopathy  Status post renal insufficiency improved  Status post hypokalemia Plan Continue present management per CCM Check labs in a.m.  LOS: 1 day    Dustin Vang 05/10/2013, 8:57 AM

## 2013-05-10 NOTE — Progress Notes (Signed)
eLink Physician-Brief Progress Note Patient Name: Dustin Vang DOB: 01/20/1956 MRN: [id number]  Date of Service  05/10/2013   HPI/Events of Note  Hypokalemia   eICU Interventions  Potassium replaced   Intervention Category Minor Interventions: Electrolytes abnormality - evaluation and management  DETERDING,ELIZABETH 05/10/2013, 5:45 AM

## 2013-05-10 NOTE — Progress Notes (Signed)
eLink Physician-Brief Progress Note Patient Name: Dustin Vang DOB: 01/20/1956 MRN: [id number]  Date of Service  05/10/2013   HPI/Events of Note  Patient with issues of agitation requiring ongoing sedation however with associated hypotension requiring increasing levels of NE gtt along with additional fluid boluses.  Also with fever but WBC was WNL last 2 measurements   eICU Interventions  Plan: Change sedation to precedex with prn fentanyl/versed Consider haldol supplementation (QTc less than 500) Blood/urine/trach aspirate culture PCXR now CBC with diff    Intervention Category Major Interventions: Delirium, psychosis, severe agitation - evaluation and management Intermediate Interventions: Hypotension - evaluation and management  DETERDING,ELIZABETH 05/10/2013, 11:35 PM

## 2013-05-10 NOTE — Progress Notes (Signed)
Name: Dustin Vang MRN: [id number] DOB: 01/20/1956  ELECTRONIC ICU PHYSICIAN NOTE  Problem:  Agitation waking up from hypothermia protocol  Intervention:  Versed/ fentanyl prn  [name] 05/10/2013, 3:50 PM

## 2013-05-10 NOTE — Progress Notes (Signed)
NUTRITION FOLLOW UP  Intervention:   1. Enteral nutrition; initiate Vital 1.2 @ 20 mL/hr continuous. Advance by 10 mL q 4 hrs to 60 mL/hr goal to provide 1728 kcal, 108g protein, 1167 mL free water.   NUTRITION DIAGNOSIS:  Inadequate oral intake related to inability to eat as evidenced by NPO, intubated.   Monitor:  1. Enteral nutrition; initiation with tolerance. Pt to meet >/=90% estimated needs with nutrition support. Progressing, RD to place orders 2. Wt/wt change; monitor trends.  Ongoing.   Assessment:   Pt admitted after cardiac arrest at home.  Now with possible anoxic brain injury. Patient is currently intubated on ventilator support.  MV: 11.8 L/min Temp:Temp (24hrs), Avg:91.7 F (33.2 C), Min:90.5 F (32.5 C), Max:94.5 F (34.7 C)  Discussed with RN. Pt is still on hypothermia protocol currently re-warming.  Likely to start TFs this afternoon.  Discussed OGT output with RN.    Height: Ht Readings from Last 1 Encounters:  05/09/13 5\' 1"  (1.549 m)    Weight Status:   Wt Readings from Last 1 Encounters:  05/09/13 153 lb (69.4 kg)    Re-estimated needs:  Kcal: 1707 Protein: 83-100 Fluid: >1.8 L/day  Skin: generalized  Diet Order: NPO   Intake/Output Summary (Last 24 hours) at 05/10/13 1050 Last data filed at 05/10/13 0900  Gross per 24 hour  Intake 3100.93 ml  Output   1360 ml  Net 1740.93 ml    Last BM: PTA   Labs:   Recent Labs Lab 05/09/13 0433  05/09/13 0824 05/09/13 1033  05/09/13 2100 05/10/13 0100 05/10/13 0409 05/10/13 0806  NA 133*  < > 140 142  < > 138 139 140 144  K 2.8*  < > 3.8 2.6*  < > 4.6 3.5 3.1* 4.0  CL 98  < > 110 109  < > 112 112 113* 114*  CO2 16*  --   --  12*  < > 12* 14* 14*  --   BUN 33*  < > 29* 31*  < > 25* 23 22 22   CREATININE 1.22  < > 1.30 1.26  < > 1.08 1.00 0.94 1.00  CALCIUM 7.2*  --   --  7.5*  < > 7.3* 7.5* 7.4*  --   MG 2.0  --   --  1.9  --  2.2  --   --   --   PHOS  --   --   --  1.0*  --   --    --   --   --   GLUCOSE 601*  < > 549* 396*  < > 237* 134* 115* 100*  < > = values in this interval not displayed.  CBG (last 3)   Recent Labs  05/10/13 0254 05/10/13 0358 05/10/13 0500  GLUCAP 91 102* 102*    Scheduled Meds: . ampicillin-sulbactam (UNASYN) IV  3 g Intravenous Q6H  . antiseptic oral rinse  15 mL Mouth Rinse QID  . artificial tears  1 application Both Eyes Q8H  . aspirin EC  81 mg Oral Daily  . atorvastatin  80 mg Oral q1800  . chlorhexidine  15 mL Mouth Rinse BID  . feeding supplement (VITAL AF 1.2 CAL)  1,000 mL Per Tube Q24H  . heparin subcutaneous  5,000 Units Subcutaneous Q8H  . hydrocortisone sodium succinate  50 mg Intravenous Q6H  . insulin aspart  2-6 Units Subcutaneous Q4H  . insulin glargine  30 Units Subcutaneous Q24H  . pantoprazole (  PROTONIX) IV  40 mg Intravenous Q24H  . Ticagrelor  90 mg Oral BID    Continuous Infusions: . sodium chloride    . cisatracurium (NIMBEX) infusion    . dextrose    . fentaNYL infusion INTRAVENOUS 50 mcg/hr (05/09/13 0732)  . insulin (NOVOLIN-R) infusion 2.9 Units/hr (05/10/13 0500)  . midazolam (VERSED) infusion 2 mg/hr (05/09/13 0733)  . norepinephrine (LEVOPHED) Adult infusion 8 mcg/min (05/10/13 0818)  . norepinephrine (LEVOPHED) Adult infusion      Loyce Dys, MS RD LDN Clinical Inpatient Dietitian Pager: [phone] Weekend/After hours pager: [phone]

## 2013-05-10 NOTE — Plan of Care (Signed)
Problem: Phase I Progression Outcomes Goal: Cool target temp reached 2 hrs from start Outcome: Not Met (add Reason) Met in 3 hrs 28 min

## 2013-05-10 NOTE — Progress Notes (Signed)
Name: Dustin Vang MRN: [id number] DOB: 01/20/1956  ELECTRONIC ICU PHYSICIAN NOTE  Problem:  Hypotensive, very sensitive to sedation which he needs to keep from fighting the vent   Intake/Output Summary (Last 24 hours) at 05/10/13 2245 Last data filed at 05/10/13 1911  Gross per 24 hour  Intake  762.2 ml  Output   1455 ml  Net -692.8 ml     CVP:  [8 mmHg-13 mmHg] 8 mmHg    Intervention:  NS x 500 cc   [name] 05/10/2013, 10:45 PM

## 2013-05-10 NOTE — Progress Notes (Signed)
**Note De-identified  Obfuscation** RT note: sputum collected and sent to lab. 

## 2013-05-10 NOTE — Progress Notes (Signed)
PULMONARY  / CRITICAL CARE MEDICINE  Name: Gabriela Giannelli MRN: [id number] DOB: 01/20/1956    ADMISSION DATE:  05/09/2013  PRIMARY SERVICE: PCCM  CHIEF COMPLAINT:  Cardiac arrest, STEMI  BRIEF PATIENT DESCRIPTION:  62 years old male admitted after cardiac arrest at home. Initial rhythm at EMS arrival was V fib. Down time about 15 minutes. Return to spontaneous circulation after multiple shocks, epinephrine and amiodarone. EKG with inferolateral STEMI. Cath  100% rca stent.  SIGNIFICANT EVENTS / STUDIES:  EKG: Inferolateral STEMI 8/25- cath stent rca 8/25 hypothermia protocol  LINES / TUBES: 8/24 ett>>> 8/25 rt ij>>> 8/24 rt rad>>> 8/25 rt art [facility] cath lab>>>8/25  CULTURES: Sputum 8/26>>>  ANTIBIOTICS: unasyn 8/26>>>  SUBJECTIVE: rewarming started  VITAL SIGNS: Temp:  [90.5 F (32.5 C)-93.2 F (34 C)] 93.2 F (34 C) (08/26 0800) Pulse Rate:  [60-83] 83 (08/26 0800) Resp:  [18-35] 35 (08/26 0800) BP: (100-132)/(69-92) 119/82 mmHg (08/26 0800) SpO2:  [90 %-100 %] 90 % (08/26 0800) FiO2 (%):  [40 %-100 %] 60 % (08/26 0800) HEMODYNAMICS: CVP:  [8 mmHg-14 mmHg] 9 mmHg VENTILATOR SETTINGS: Vent Mode:  [-] PRVC FiO2 (%):  [40 %-100 %] 60 % Set Rate:  [18 bmp-35 bmp] 35 bmp Vt Set:  [450 mL-600 mL] 450 mL PEEP:  [5 cmH20-10 cmH20] 5 cmH20 Plateau Pressure:  [22 cmH20-25 cmH20] 25 cmH20 INTAKE / OUTPUT: Intake/Output     08/25 0701 - 08/26 0700 08/26 0701 - 08/27 0700   I.V. (mL/kg) 2615.4 (37.7) 20.7 (0.3)   NG/GT 230    IV Piggyback 860    Total Intake(mL/kg) 3705.4 (53.4) 20.7 (0.3)   Urine (mL/kg/hr) 2190 (1.3) 15 (0.1)   Total Output 2190 15   Net +1515.4 +5.7          PHYSICAL EXAMINATION: General: paralyzed required still Neuro:perr 4 mm sluggish HEENT: ett, line claan today PULM: ronchi mild CV: s1 s2 RRR no r GI: soft, BS hypo, nt, nd Extremities: edema mild   LABS:  CBC Recent Labs     05/09/13  0433   05/09/13  1033  05/09/13  1420  05/10/13  0409  05/10/13  0806  WBC  13.4*   --   9.2   --   6.9   --   HGB  12.8*   < >  12.7*  12.2*  12.3*  11.6*  HCT  36.7*   < >  36.6*  36.0*  33.9*  34.0*  PLT  315   --   325   --   238   --    < > = values in this interval not displayed.   Coag's Recent Labs     05/09/13  0359  05/09/13  0433  05/09/13  1033  APTT  29  83*  38*  INR  1.20  2.21*  1.44   BMET Recent Labs     05/09/13  2100  05/10/13  0100  05/10/13  0409  05/10/13  0806  NA  138  139  140  144  K  4.6  3.5  3.1*  4.0  CL  112  112  113*  114*  CO2  12*  14*  14*   --   BUN  25*  23  22  22   CREATININE  1.08  1.00  0.94  1.00  GLUCOSE  237*  134*  115*  100*   Electrolytes Recent Labs     05/09/13  0433  05/09/13  1033   05/09/13  2100  05/10/13  0100  05/10/13  0409  CALCIUM  7.2*  7.5*   < >  7.3*  7.5*  7.4*  MG  2.0  1.9   --   2.2   --    --   PHOS   --   1.0*   --    --    --    --    < > = values in this interval not displayed.   Sepsis Markers No results found for this basename: LACTICACIDVEN, PROCALCITON, O2SATVEN,  in the last 72 hours ABG Recent Labs     05/09/13  1051  05/09/13  1242  05/10/13  0411  PHART  7.241*  7.268*  7.441  PCO2ART  25.0*  25.1*  21.0*  PO2ART  200.0*  61.0*  41.0*   Liver Enzymes Recent Labs     05/09/13  0220  05/09/13  0402  AST  195*  226*  ALT  204*  225*  ALKPHOS  75  59  BILITOT  0.1*  0.1*  ALBUMIN  3.1*  2.3*   Cardiac Enzymes Recent Labs     05/09/13  0220   05/09/13  1108  05/09/13  1708  05/10/13  0409  TROPONINI  1.17*   < >  >20.00*  >20.00*  >20.00*  PROBNP  59.6   --    --    --    --    < > = values in this interval not displayed.   Glucose Recent Labs     05/09/13  2346  05/10/13  0052  05/10/13  0155  05/10/13  0254  05/10/13  0358  05/10/13  0500  GLUCAP  124*  131*  114*  91  102*  102*    Imaging Dg Chest Port 1 View  05/10/2013   *RADIOLOGY REPORT*  Clinical Data: Check endotracheal  tube  PORTABLE CHEST - 1 VIEW  Comparison: Prior chest x-ray 08/08 09/19/2012  Findings: The endotracheal tube is 4 cm above the carina.  Stable position of left IJ approach central venous catheter with catheter tip in the distal SVC.  Nasogastric tube is present, the tip lies within the stomach.  External defibrillator pads project over the chest.  Slightly increased right greater than left pleural effusions. Stable cardiomegaly.  Background pulmonary vascular congestion is unchanged.  No pneumothorax.  No acute osseous abnormality.  IMPRESSION:  1.  Slightly increased right greater than left pleural effusions with associated bibasilar atelectasis. 2.  Stable and satisfactory support apparatus.   Original Report Authenticated By: Malachy Moan, M.D.   Dg Chest Port 1 View  05/09/2013   *RADIOLOGY REPORT*  Clinical Data: Post central line placement  PORTABLE CHEST - 1 VIEW  Comparison: 05/09/2013  Findings: Left IJ catheter tip projects over the cavoatrial junction.  No pneumothorax. Endotracheal tube tip projects 3.6 cm proximal to the carina. The NG tube has been advanced, with side port projecting over the left upper quadrant.  The tip of the catheter descends below the level of the image.  Support pad again obscures much of the left hemithorax.  Mild hazy right lung opacity suggests mild atelectasis.  IMPRESSION: Left IJ catheter tip projects over the cavoatrial junction.  No pneumothorax.  Mild right lung opacity, favor atelectasis.   Original Report Authenticated By: Jearld Lesch, M.D.   Dg Chest Port 1 View  05/09/2013   *RADIOLOGY REPORT*  Clinical Data:  Post intubation  PORTABLE CHEST - 1 VIEW  Comparison: None.  Findings: Endotracheal tube tip projects 2.7 cm proximal to the carina.  NG tube descends over the proximal stomach with the side port in the esophagus.  Support devices obscure much of the left hemithorax.  No overt consolidation, pleural effusion, or pneumothorax.  No acute osseous  finding.  There is a large amount of gas within the left upper quadrant, presumably within stomach.  IMPRESSION: Endotracheal tube tip projects 2.7 cm proximal to the carina.  Large amount of gas within the left upper quadrant, presumably within stomach. Correlate clinically and attention on follow-up.  The NG side port is in the esophagus.  Therefore, recommend the tube be advanced 5-10 cm.   Original Report Authenticated By: Jearld Lesch, M.D.     CXR:  New haziness rt base , increase left hilar infiltrate, ett wnl, line nwl  ASSESSMENT / PLAN:  PULMONARY A: 1) Acute hypoxemic respiratory failure secondary to V fib arrest, combined met / resp  Acidosis Hypothermia protocol Hypoxia R/o aspiration event P:   - ABg reviewed, reduce mV slight and repeat abg, pao2 41 error? -repeat abg pao2 on 60% -pcxr in am to follow infiltrates  CARDIOVASCULAR A:  1) V fib arrest 2) Inferolateral STEMI, stent rca 3)relative shock, likely rel AI P:  -asa -levophed to MAP 60 as rewarm on going -may need bolus with rewarm -cortisol reviewed, add stress steroids for now  RENAL A:   1) Acute renal failure, likely secondary to shock 2) Hypokalemia improved P:   - K supp with "cold diuresis", then limit with rewarm -bmet q4h on protocol to am   GASTROINTESTINAL A:   1) NPO P:   - GI prophylaxis with protonix -lft in am  -likely feed as will not extubate with current vent needs  HEMATOLOGIC A: DVt prevention  1) sub q heparin Cbc in am   INFECTIOUS A:   1) Hypothermia, at risk PNA,. Sepsis, concern asp P:   - sputum - add unasyn -pcxr follow up  ENDOCRINE A:   1) No known history of DM, rel AI P:   - Insulin drip to off as goal -likely to need lantus when TF started -cortisol reviewed, steroids added  NEUROLOGIC A:   1)likely anoxic brain injury P:   - Hypothermia protocol to rewarm now - Paralysis and sedation per protocol.   TODAY'S SUMMARY: ABg repeat, reduce  MV, add TF, rewarm, add unasyn  I have personally obtained a history, examined the patient, evaluated laboratory and imaging results, formulated the assessment and plan and placed orders. CRITICAL CARE: The patient is critically ill with multiple organ systems failure and requires high complexity decision making for assessment and support, frequent evaluation and titration of therapies, application of advanced monitoring technologies and extensive interpretation of multiple databases. Critical Care Time devoted to patient care services described in this note is 45 minutes.   [name]. [name], MD, FACP Pgr: [phone] [facility]

## 2013-05-11 ENCOUNTER — Inpatient Hospital Stay (HOSPITAL_COMMUNITY): Payer: Medicaid Other

## 2013-05-11 ENCOUNTER — Encounter (HOSPITAL_COMMUNITY)
Admission: EM | Disposition: A | Discharge status: Home / Self Care | Payer: Self-pay | Source: Home / Self Care | Attending: Critical Care Medicine

## 2013-05-11 DIAGNOSIS — I472 Ventricular tachycardia: Secondary | ICD-10-CM

## 2013-05-11 HISTORY — PX: LEFT HEART CATHETERIZATION WITH CORONARY ANGIOGRAM: SHX5451

## 2013-05-11 HISTORY — PX: CORONARY STENT PLACEMENT: SHX1402

## 2013-05-11 LAB — TROPONIN I
Troponin I: >20 ng/mL (ref <0.30)
Troponin I: >20 ng/mL (ref <0.30)

## 2013-05-11 LAB — POCT I-STAT 3, ART BLOOD GAS (G3+)
Acid-base deficit: 5 mmol/L — ABNORMAL HIGH (ref 0.0–2.0)
Bicarbonate: 18.5 mEq/L — ABNORMAL LOW (ref 20.0–24.0)
Bicarbonate: 16 mEq/L — ABNORMAL LOW (ref 20.0–24.0)
Bicarbonate: 19 mEq/L — ABNORMAL LOW (ref 20.0–24.0)
O2 Saturation: 99 %
O2 Saturation: 94 %
Patient temperature: 37.4
TCO2: 20 mmol/L (ref 0–100)
TCO2: 17 mmol/L (ref 0–100)
TCO2: 20 mmol/L (ref 0–100)
pCO2 arterial: 48.3 mmHg — ABNORMAL HIGH (ref 35.0–45.0)
pCO2 arterial: 25.3 mmHg — ABNORMAL LOW (ref 35.0–45.0)
pH, Arterial: 7.515 — ABNORMAL HIGH (ref 7.350–7.450)
pO2, Arterial: 139 mmHg — ABNORMAL HIGH (ref 80.0–100.0)
pO2, Arterial: 205 mmHg — ABNORMAL HIGH (ref 80.0–100.0)
pO2, Arterial: 67 mmHg — ABNORMAL LOW (ref 80.0–100.0)

## 2013-05-11 LAB — POCT I-STAT, CHEM 8
BUN: 29 mg/dL — ABNORMAL HIGH (ref 6–23)
BUN: 29 mg/dL — ABNORMAL HIGH (ref 6–23)
BUN: 25 mg/dL — ABNORMAL HIGH (ref 6–23)
Calcium, Ion: 1.09 mmol/L — ABNORMAL LOW (ref 1.13–1.30)
Calcium, Ion: 0.95 mmol/L — ABNORMAL LOW (ref 1.13–1.30)
Calcium, Ion: 1.11 mmol/L — ABNORMAL LOW (ref 1.13–1.30)
Calcium, Ion: 1.03 mmol/L — ABNORMAL LOW (ref 1.13–1.30)
Calcium, Ion: 1.01 mmol/L — ABNORMAL LOW (ref 1.13–1.30)
Chloride: 105 mEq/L (ref 96–112)
Chloride: 105 mEq/L (ref 96–112)
Chloride: 116 mEq/L — ABNORMAL HIGH (ref 96–112)
Chloride: 113 mEq/L — ABNORMAL HIGH (ref 96–112)
Chloride: 115 mEq/L — ABNORMAL HIGH (ref 96–112)
Creatinine, Ser: 1.4 mg/dL — ABNORMAL HIGH (ref 0.50–1.35)
Creatinine, Ser: 1.3 mg/dL (ref 0.50–1.35)
Glucose, Bld: 100 mg/dL — ABNORMAL HIGH (ref 70–99)
Glucose, Bld: 139 mg/dL — ABNORMAL HIGH (ref 70–99)
Glucose, Bld: 138 mg/dL — ABNORMAL HIGH (ref 70–99)
HCT: 38 % — ABNORMAL LOW (ref 39.0–52.0)
HCT: 30 % — ABNORMAL LOW (ref 39.0–52.0)
HCT: 34 % — ABNORMAL LOW (ref 39.0–52.0)
HCT: 34 % — ABNORMAL LOW (ref 39.0–52.0)
HCT: 33 % — ABNORMAL LOW (ref 39.0–52.0)
Hemoglobin: 12.9 g/dL — ABNORMAL LOW (ref 13.0–17.0)
Hemoglobin: 12.9 g/dL — ABNORMAL LOW (ref 13.0–17.0)
Hemoglobin: 11.6 g/dL — ABNORMAL LOW (ref 13.0–17.0)
Hemoglobin: 11.6 g/dL — ABNORMAL LOW (ref 13.0–17.0)
Hemoglobin: 11.2 g/dL — ABNORMAL LOW (ref 13.0–17.0)
Potassium: 3.1 mEq/L — ABNORMAL LOW (ref 3.5–5.1)
Potassium: 4 mEq/L (ref 3.5–5.1)
Potassium: 3.8 mEq/L (ref 3.5–5.1)
Potassium: 4.9 mEq/L (ref 3.5–5.1)
Sodium: 141 mEq/L (ref 135–145)
Sodium: 139 mEq/L (ref 135–145)
Sodium: 140 mEq/L (ref 135–145)
TCO2: 21 mmol/L (ref 0–100)
TCO2: 15 mmol/L (ref 0–100)
TCO2: 12 mmol/L (ref 0–100)
TCO2: 16 mmol/L (ref 0–100)

## 2013-05-11 LAB — CBC
HCT: 28 % — ABNORMAL LOW (ref 39.0–52.0)
MCH: 30.7 pg (ref 26.0–34.0)
MCV: 87.8 fL (ref 78.0–100.0)
RBC: 3.19 MIL/uL — ABNORMAL LOW (ref 4.22–5.81)
RDW: 14.1 % (ref 11.5–15.5)
WBC: 14.7 10*3/uL — ABNORMAL HIGH (ref 4.0–10.5)

## 2013-05-11 LAB — CBC WITH DIFFERENTIAL/PLATELET
Basophils Absolute: 0 10*3/uL (ref 0.0–0.1)
Basophils Absolute: 0 10*3/uL (ref 0.0–0.1)
Eosinophils Absolute: 0 10*3/uL (ref 0.0–0.7)
HCT: 29 % — ABNORMAL LOW (ref 39.0–52.0)
Lymphocytes Relative: 8 % — ABNORMAL LOW (ref 12–46)
Lymphs Abs: 0.3 10*3/uL — ABNORMAL LOW (ref 0.7–4.0)
MCH: 30.3 pg (ref 26.0–34.0)
MCHC: 35.2 g/dL (ref 30.0–36.0)
MCV: 87.1 fL (ref 78.0–100.0)
Monocytes Absolute: 0.5 10*3/uL (ref 0.1–1.0)
Neutro Abs: 6 10*3/uL (ref 1.7–7.7)
Neutro Abs: 6.4 10*3/uL (ref 1.7–7.7)
Neutrophils Relative %: 85 % — ABNORMAL HIGH (ref 43–77)
Platelets: 244 10*3/uL (ref 150–400)
Platelets: 233 10*3/uL (ref 150–400)
RDW: 14.1 % (ref 11.5–15.5)
RDW: 13.9 % (ref 11.5–15.5)
WBC: 7.5 10*3/uL (ref 4.0–10.5)

## 2013-05-11 LAB — COMPREHENSIVE METABOLIC PANEL
ALT: 198 U/L — ABNORMAL HIGH (ref 0–53)
Alkaline Phosphatase: 53 U/L (ref 39–117)
BUN: 23 mg/dL (ref 6–23)
CO2: 17 mEq/L — ABNORMAL LOW (ref 19–32)
GFR calc Af Amer: 64 mL/min — ABNORMAL LOW (ref >90)
GFR calc non Af Amer: 55 mL/min — ABNORMAL LOW (ref >90)
Glucose, Bld: 145 mg/dL — ABNORMAL HIGH (ref 70–99)
Potassium: 4.2 mEq/L (ref 3.5–5.1)
Sodium: 141 mEq/L (ref 135–145)
Total Bilirubin: 0.3 mg/dL (ref 0.3–1.2)

## 2013-05-11 LAB — BASIC METABOLIC PANEL
BUN: 23 mg/dL (ref 6–23)
Creatinine, Ser: 1.5 mg/dL — ABNORMAL HIGH (ref 0.50–1.35)
GFR calc non Af Amer: 50 mL/min — ABNORMAL LOW (ref >90)
Glucose, Bld: 178 mg/dL — ABNORMAL HIGH (ref 70–99)
Potassium: 4.5 mEq/L (ref 3.5–5.1)

## 2013-05-11 LAB — GLUCOSE, CAPILLARY
Glucose-Capillary: 142 mg/dL — ABNORMAL HIGH (ref 70–99)
Glucose-Capillary: 117 mg/dL — ABNORMAL HIGH (ref 70–99)
Glucose-Capillary: 138 mg/dL — ABNORMAL HIGH (ref 70–99)
Glucose-Capillary: 193 mg/dL — ABNORMAL HIGH (ref 70–99)

## 2013-05-11 LAB — CK TOTAL AND CKMB (NOT AT ARMC): Relative Index: 6.3 — ABNORMAL HIGH (ref 0.0–2.5)

## 2013-05-11 SURGERY — LEFT HEART CATHETERIZATION WITH CORONARY ANGIOGRAM
Anesthesia: LOCAL

## 2013-05-11 MED ORDER — VANCOMYCIN HCL 10 G IV SOLR
1250.0000 mg | Freq: Once | INTRAVENOUS | Status: AC
  Administered 2013-05-11: 1250 mg via INTRAVENOUS
  Filled 2013-05-11: qty 1250

## 2013-05-11 MED ORDER — SODIUM CHLORIDE 0.9 % IV SOLN
25.0000 ug/h | INTRAVENOUS | Status: DC
  Administered 2013-05-11 – 2013-05-14 (×5): 200 ug/h via INTRAVENOUS
  Filled 2013-05-11 (×6): qty 50

## 2013-05-11 MED ORDER — HEPARIN (PORCINE) IN NACL 2-0.9 UNIT/ML-% IJ SOLN
INTRAMUSCULAR | Status: AC
  Filled 2013-05-11: qty 1000

## 2013-05-11 MED ORDER — LIDOCAINE HCL (PF) 1 % IJ SOLN
INTRAMUSCULAR | Status: AC
  Filled 2013-05-11: qty 30

## 2013-05-11 MED ORDER — HEPARIN SODIUM (PORCINE) 1000 UNIT/ML IJ SOLN
INTRAMUSCULAR | Status: AC
  Filled 2013-05-11: qty 1

## 2013-05-11 MED ORDER — MIDAZOLAM HCL 2 MG/2ML IJ SOLN
INTRAMUSCULAR | Status: AC
  Administered 2013-05-11: 4 mg via INTRAVENOUS
  Filled 2013-05-11: qty 4

## 2013-05-11 MED ORDER — ACETAMINOPHEN 160 MG/5ML PO SOLN
650.0000 mg | ORAL | Status: DC | PRN
  Administered 2013-05-11 – 2013-05-12 (×2): 650 mg via ORAL
  Filled 2013-05-11 (×3): qty 20.3

## 2013-05-11 MED ORDER — DEXTROSE 5 % IV SOLN
1.0000 g | Freq: Three times a day (TID) | INTRAVENOUS | Status: DC
  Administered 2013-05-11 – 2013-05-16 (×15): 1 g via INTRAVENOUS
  Filled 2013-05-11 (×17): qty 1

## 2013-05-11 MED ORDER — MIDAZOLAM BOLUS VIA INFUSION
1.0000 mg | INTRAVENOUS | Status: DC | PRN
  Administered 2013-05-14: 2 mg via INTRAVENOUS
  Filled 2013-05-11: qty 2

## 2013-05-11 MED ORDER — VANCOMYCIN HCL IN DEXTROSE 750-5 MG/150ML-% IV SOLN
750.0000 mg | Freq: Two times a day (BID) | INTRAVENOUS | Status: DC
  Administered 2013-05-12 – 2013-05-14 (×5): 750 mg via INTRAVENOUS
  Filled 2013-05-11 (×6): qty 150

## 2013-05-11 MED ORDER — SODIUM BICARBONATE 8.4 % IV SOLN
INTRAVENOUS | Status: AC
  Administered 2013-05-11: 100 meq
  Filled 2013-05-11: qty 50

## 2013-05-11 MED ORDER — FENTANYL BOLUS VIA INFUSION
25.0000 ug | Freq: Four times a day (QID) | INTRAVENOUS | Status: DC | PRN
  Administered 2013-05-12: 50 ug via INTRAVENOUS
  Administered 2013-05-13 – 2013-05-14 (×2): 100 ug via INTRAVENOUS
  Filled 2013-05-11: qty 100

## 2013-05-11 MED ORDER — FENTANYL CITRATE 0.05 MG/ML IJ SOLN
INTRAMUSCULAR | Status: AC
  Administered 2013-05-11: 50 ug via INTRAVENOUS
  Filled 2013-05-11: qty 2

## 2013-05-11 MED ORDER — SODIUM CHLORIDE 0.9 % IV SOLN
1.0000 mg/h | INTRAVENOUS | Status: DC
  Administered 2013-05-11 – 2013-05-13 (×2): 2 mg/h via INTRAVENOUS
  Filled 2013-05-11 (×5): qty 10

## 2013-05-11 MED ORDER — NITROGLYCERIN 0.2 MG/ML ON CALL CATH LAB
INTRAVENOUS | Status: AC
  Filled 2013-05-11: qty 1

## 2013-05-11 MED FILL — Medication: Qty: 1 | Status: AC

## 2013-05-11 NOTE — Procedures (Signed)
Bronchoscopy Procedure Note Dustin Vang [id number] 01/20/1956  Procedure: Bronchoscopy Indications: Diagnostic evaluation of the airways, Obtain specimens for culture and/or other diagnostic studies and Remove secretions  Procedure Details Consent: Unable to obtain consent because of emergent medical necessity. Time Out: Verified patient identification, verified procedure, site/side was marked, verified correct patient position, special equipment/implants available, medications/allergies/relevent history reviewed, required imaging and test results available.  Performed  In preparation for procedure, patient was given 100% FiO2 and bronchoscope lubricated. Sedation: none additionjal  Airway entered and the following bronchi were examined: RUL, RML, RLL, LUL, LLL and Bronchi.   Procedures performed: Brushings performed -none Bronchoscope removed.    Evaluation Hemodynamic Status: Transient hypotension treated with pressors; O2 sats:un stable throughout Patient's Current Condition: unstable Specimens:  Sent purulent fluid Complications: No apparent complications Patient did tolerate procedure well.   Dustin Vang 05/11/2013  Bronch during near arrest to r/o mucous plug  1. No obsttruciton 2. Creamy white pus left upper division, BAl done  [employer]. [name], MD, FACP Pgr: [phone] [facility]

## 2013-05-11 NOTE — Progress Notes (Signed)
Late entry: 0830: Pt's family at bedside. Pt became extremely agitated and attempting to climb out of bed and pull out tubes. According to daughter, pt is stating "he can't breathe and needs to sit up." Pt is combative and several individuals needed to keep patient in bed. Family is very agitated and yelling. RN asked family to wait in waiting room until event is complete. Family escorted to waiting room. 0840: Dr [name] at bedside. Cuff leak heard and pt has agonal respirations over vent. Dr [name] bronched patient and multiple medications given. See code sheet for medication times and events. 1610: Code ended and EKG complete. STEMI called to [facility] and Dr Sharyn Lull. Dr Excell Seltzer at bedside to discuss plan of care with Dr [name]. 0900: Aline placed and awaiting cath lab to take patient to cath. 0915: Pt's family informed of plan of care and events with pt by Dr [name]. 0920: Pt taken to cath lab by two RNS and respiratory. Report given to cath lab and pt transferred to table. Dr Sharyn Lull at bedside.  9604: Chaplain speaking with family and providing comfort.

## 2013-05-11 NOTE — Progress Notes (Signed)
ANTIBIOTIC CONSULT NOTE - INITIAL  Pharmacy Consult for fortaz, vancomycin Indication: rule out pneumonia  No Known Allergies  Patient Measurements: Height: 5\' 1"  (154.9 cm) Weight: 159 lb 13.3 oz (72.5 kg) IBW/kg (Calculated) : 52.3   Vital Signs: Temp: 97 F (36.1 C) (08/27 0800) Temp src: Core (Comment) (08/27 0800) BP: 148/96 mmHg (08/27 0915) Pulse Rate: 104 (08/27 0920) Intake/Output from previous day: 08/26 0701 - 08/27 0700 In: 2090.5 [I.V.:690.5; NG/GT:500; IV Piggyback:900] Out: 1585 [Urine:1585] Intake/Output from this shift: Total I/O In: 136.5 [I.V.:86.5; NG/GT:50] Out: 75 [Urine:75]  Labs:  Recent Labs  05/10/13 1130 05/10/13 1559 05/10/13 2359 05/11/13 0421 05/11/13 0850  WBC  --   --  6.8 7.5 14.7*  HGB 11.6* 11.2* 10.1* 10.2* 9.8*  PLT  --   --  244 233 237  CREATININE 1.00 1.10  --  1.39*  --    Estimated Creatinine Clearance: 50.7 ml/min (by C-G formula based on Cr of 1.39). No results found for this basename: VANCOTROUGH, VANCOPEAK, VANCORANDOM, GENTTROUGH, GENTPEAK, GENTRANDOM, TOBRATROUGH, TOBRAPEAK, TOBRARND, AMIKACINPEAK, AMIKACINTROU, AMIKACIN,  in the last 72 hours     Medical History: No past medical history on file.  Medications:  Scheduled:  . antiseptic oral rinse  15 mL Mouth Rinse QID  . aspirin EC  81 mg Oral Daily  . atorvastatin  80 mg Oral q1800  . chlorhexidine  15 mL Mouth Rinse BID  . feeding supplement (VITAL AF 1.2 CAL)  1,000 mL Per Tube Q16H  . heparin subcutaneous  5,000 Units Subcutaneous Q8H  . hydrocortisone sodium succinate  50 mg Intravenous Q6H  . pantoprazole (PROTONIX) IV  40 mg Intravenous Q24H  . Ticagrelor  90 mg Oral BID     Assessment: 62 yo male with s/p arrest and hypothermia protocol now with bradycardia and hypotensive for r/o PNA to change antibiotics to fortaz and vancomycin. WBC= 14.7, tmax= 101.9, SCr= 1.38 and CrCl ~50.    unasyn 8/26>>> 8/27 8/27 vanc>>> 8/27 fortaz  8/26  resp 8/27 urine 8/27 BAL 8/27 blood x2   Goal of Therapy:  Vancomycin trough level 15-20 mcg/ml  Plan:  -Fortaz 1gm IV q8h and vancomycin 1250mg  followed by 750mg  IVq12h -Will follow renal function, cultures and clinical progress  Harland German, Pharm D 05/11/2013 10:21 AM

## 2013-05-11 NOTE — Progress Notes (Addendum)
PULMONARY  / CRITICAL CARE MEDICINE  Name: Dustin Vang MRN: [id number] DOB: 01/20/1956    ADMISSION DATE:  05/09/2013  PRIMARY SERVICE: PCCM  CHIEF COMPLAINT:  Cardiac arrest, STEMI  BRIEF PATIENT DESCRIPTION:  62 years old male admitted after cardiac arrest at home. Initial rhythm at EMS arrival was V fib. Down time about 15 minutes. Return to spontaneous circulation after multiple shocks, epinephrine and amiodarone. EKG with inferolateral STEMI. Cath  100% rca stent.  SIGNIFICANT EVENTS / STUDIES:  EKG: Inferolateral STEMI 8/25- cath stent rca 8/25 hypothermia protocol 8/26- rewarm, followed commands, agitation 8/27- near arrest, epi, atropine, noted STEMI Inferior, rising pressors  LINES / TUBES: 8/24 ett>>> 8/25 rt ij>>> 8/24 rt rad>>>8/27 8/25 rt art [facility] cath lab>>>8/25 8/27 A line left fem>>>  CULTURES: Sputum 8/26>>> BC 8/27>>> 8/27 urine>>> BAL bronch 8/27>>>  ANTIBIOTICS: unasyn 8/26>>> 8/27 vanc>>>  SUBJECTIVE: agg  VITAL SIGNS: Temp:  [94.3 F (34.6 C)-101.9 F (38.8 C)] 97 F (36.1 C) (08/27 0800) Pulse Rate:  [70-132] 70 (08/27 0800) Resp:  [21-35] 26 (08/27 0800) BP: (53-139)/(33-94) 97/71 mmHg (08/27 0800) SpO2:  [94 %-100 %] 98 % (08/27 0800) Arterial Line BP: (65-141)/(39-86) 107/67 mmHg (08/27 0800) FiO2 (%):  [40 %-60 %] 40 % (08/27 0800) Weight:  [72.5 kg (159 lb 13.3 oz)] 72.5 kg (159 lb 13.3 oz) (08/27 0432) HEMODYNAMICS: CVP:  [6 mmHg-14 mmHg] 13 mmHg VENTILATOR SETTINGS: Vent Mode:  [-] PRVC FiO2 (%):  [40 %-60 %] 40 % Set Rate:  [26 bmp] 26 bmp Vt Set:  [450 mL] 450 mL PEEP:  [5 cmH20] 5 cmH20 Plateau Pressure:  [17 cmH20-19 cmH20] 19 cmH20 INTAKE / OUTPUT: Intake/Output     08/26 0701 - 08/27 0700 08/27 0701 - 08/28 0700   I.V. (mL/kg) 690.5 (9.5) 43.1 (0.6)   NG/GT 500 50   IV Piggyback 900    Total Intake(mL/kg) 2090.5 (28.8) 93.1 (1.3)   Urine (mL/kg/hr) 1585 (0.9)    Total Output 1585     Net +505.5 +93.1           PHYSICAL EXAMINATION: General: agitated then to agonal, per 2 mm Neuro:per rass -3 HEENT: ett, line clean today PULM: ronchi rt worse left CV: s1 s2 RRR no r GI: soft, BS wnl, nt, nd Extremities: edema mild   LABS:  CBC Recent Labs     05/10/13  0409   05/10/13  1559  05/10/13  2359  05/11/13  0421  WBC  6.9   --    --   6.8  7.5  HGB  12.3*   < >  11.2*  10.1*  10.2*  HCT  33.9*   < >  33.0*  29.0*  29.0*  PLT  238   --    --   244  233   < > = values in this interval not displayed.   Coag's Recent Labs     05/09/13  0359  05/09/13  0433  05/09/13  1033  APTT  29  83*  38*  INR  1.20  2.21*  1.44   BMET Recent Labs     05/10/13  0100  05/10/13  0409   05/10/13  1130  05/10/13  1559  05/11/13  0421  NA  139  140   < >  144  144  141  K  3.5  3.1*   < >  3.8  4.9  4.2  CL  112  113*   < >  113*  115*  113*  CO2  14*  14*   --    --    --   17*  BUN  23  22   < >  16  19  23   CREATININE  1.00  0.94   < >  1.00  1.10  1.39*  GLUCOSE  134*  115*   < >  139*  138*  145*   < > = values in this interval not displayed.   Electrolytes Recent Labs     05/09/13  0433  05/09/13  1033   05/09/13  2100  05/10/13  0100  05/10/13  0409  05/11/13  0421  CALCIUM  7.2*  7.5*   < >  7.3*  7.5*  7.4*  7.1*  MG  2.0  1.9   --   2.2   --    --    --   PHOS   --   1.0*   --    --    --    --    --    < > = values in this interval not displayed.   Sepsis Markers No results found for this basename: LACTICACIDVEN, PROCALCITON, O2SATVEN,  in the last 72 hours ABG Recent Labs     05/10/13  0411  05/10/13  1044  05/11/13  0423  PHART  7.441  7.299*  7.388  PCO2ART  21.0*  32.3*  31.5*  PO2ART  41.0*  59.0*  139.0*   Liver Enzymes Recent Labs     05/09/13  0220  05/09/13  0402  05/11/13  0421  AST  195*  226*  234*  ALT  204*  225*  198*  ALKPHOS  75  59  53  BILITOT  0.1*  0.1*  0.3  ALBUMIN  3.1*  2.3*  2.2*   Cardiac Enzymes Recent Labs      05/09/13  0220   05/09/13  1708  05/10/13  0409  05/11/13  0420  TROPONINI  1.17*   < >  >20.00*  >20.00*  >20.00*  PROBNP  59.6   --    --    --    --    < > = values in this interval not displayed.   Glucose Recent Labs     05/10/13  0500  05/10/13  0624  05/10/13  2113  05/10/13  2302  05/11/13  0417  05/11/13  0728  GLUCAP  102*  90  112*  104*  142*  117*    Imaging Dg Chest Port 1 View  05/11/2013   *RADIOLOGY REPORT*  Clinical Data: Intubated patient.  PORTABLE CHEST - 1 VIEW  Comparison: Chest 05/09/2013 and 05/10/2013.  Findings: Support tubes and lines are unchanged and in good position.  No pneumothorax.  Right basilar atelectasis is not markedly changed.  There has been some increase in left basilar atelectasis.  There are likely small bilateral pleural effusions. No pneumothorax.  Cardiomegaly without edema noted.  IMPRESSION:  1.  Small bilateral pleural effusions and basilar atelectasis. Atelectasis on the left appears somewhat worsened. 2.  Support apparatus in good position.   Original Report Authenticated By: Holley Dexter, M.D.   Dg Chest Port 1 View  05/10/2013   *RADIOLOGY REPORT*  Clinical Data: Check endotracheal tube  PORTABLE CHEST - 1 VIEW  Comparison: Prior chest x-ray 08/08 09/19/2012  Findings: The endotracheal tube is 4 cm above the carina.  Stable position of  left IJ approach central venous catheter with catheter tip in the distal SVC.  Nasogastric tube is present, the tip lies within the stomach.  External defibrillator pads project over the chest.  Slightly increased right greater than left pleural effusions. Stable cardiomegaly.  Background pulmonary vascular congestion is unchanged.  No pneumothorax.  No acute osseous abnormality.  IMPRESSION:  1.  Slightly increased right greater than left pleural effusions with associated bibasilar atelectasis. 2.  Stable and satisfactory support apparatus.   Original Report Authenticated By: Malachy Moan, M.D.      CXR:  Atx, line wnl, ett wnl  ASSESSMENT / PLAN:  PULMONARY A: 1) Acute hypoxemic respiratory failure secondary to V fib arrest, combined met / resp  Acidosis R/o aspiration event? 8/27 near arrest, STEMI re occurence P:   - ABg STAT, rate increased, repeat abg -leave on 100% with events in cath lab now -pcxr stat, to ensure not worsening atx -bronch done see note  CARDIOVASCULAR A:  1) V fib arrest 2) Inferolateral STEMI, stent rca 3) r/o in stent thombosis 8/27 4)relative shock, likely rel AI P:  -asa -levophed to MAP 60 goal, increasing, may need to add vaso -continue stress steroids -STAT to cath lab with inferior elevation -STAT a line placed -cvp  RENAL A:   1) Acute renal failure, likely secondary to shock 2) small NON AG  P:   -avoid saline -add kvo 1/2 NS -cvp  GASTROINTESTINAL A:   1) NPO P:   - GI prophylaxis with protonix -NPO with shock / stenosis -lft follow up  HEMATOLOGIC A: DVt prevention  1) sub q heparin Cbc If in stent thrombosis, anticoag per cards  INFECTIOUS A:   1) r/o PNA (unimpressed), Sepsis, concern asp P:   - sputum, BC tofollow -follow BAL - change unasyn to ceftaz -add vanc  ENDOCRINE A:   1) No known history of DM, rel AI P:   - ssi -steroids  NEUROLOGIC A:   1)s/p arrest, followed commands, encephalopathy, delirium P:   - Hypothermia protocol to rewarm now - fent drip likely best for now  TODAY'S SUMMARY: NEar arrest, STAT back to cath lab, stent thrombosis?, levo rising, famiy updated  I have personally obtained a history, examined the patient, evaluated laboratory and imaging results, formulated the assessment and plan and placed orders. CRITICAL CARE: The patient is critically ill with multiple organ systems failure and requires high complexity decision making for assessment and support, frequent evaluation and titration of therapies, application of advanced monitoring technologies and extensive  interpretation of multiple databases. Critical Care Time devoted to patient care services described in this note is 85 minutes.   [name]. [name], MD, FACP Pgr: [phone] [facility]

## 2013-05-11 NOTE — Procedures (Signed)
resus note  Found brady, agonal, NOT pulseless Rate 40 Atropine, epi given, bicarb as appeared acidotic, r/o hyperk Labs sent Improved HR and BP Levo increased A line placed ECG - ST elevation inferior.  To cath lab  [name]. [name], MD, FACP Pgr: [phone] [facility]

## 2013-05-11 NOTE — Progress Notes (Signed)
*  PRELIMINARY RESULTS* Echocardiogram 2D Echocardiogram has been performed.  Dustin Vang 05/11/2013, 11:05 AM

## 2013-05-11 NOTE — Procedures (Signed)
Arterial Catheter Insertion Procedure Note Dustin Vang [id number] 01/20/1956  Procedure: Insertion of Arterial Catheter  Indications: Blood pressure monitoring and Frequent blood sampling  Procedure Details Consent: Unable to obtain consent because of emergent medical necessity. Time Out: Verified patient identification, verified procedure, site/side was marked, verified correct patient position, special equipment/implants available, medications/allergies/relevent history reviewed, required imaging and test results available.  Performed  Maximum sterile technique was used including antiseptics, cap, gloves, gown, hand hygiene, mask and sheet. Skin prep: Chlorhexidine; local anesthetic administered 20 gauge catheter was inserted into left femoral artery using the Seldinger technique.  Evaluation Blood flow good; BP tracing good. Complications: No apparent complications.   Dustin Vang 05/11/2013  emergent , mere arrest  Dustin Vang. [name], MD, FACP Pgr: [phone] [facility]

## 2013-05-11 NOTE — Progress Notes (Signed)
Chaplain Note:   Responded to Code Stemi and met the family of Pt. Wife and three daughters were present. They were anxious and fearful about their father's condition wanting to know if he was " stable" . They knew he was having a Cath because of an incident which occurred while they were in the room. I inquired of Cath lab staff about his condition and what could be communicated and shared that cath lab staff were preparing to begin procedure and nothing in his condition was preventing them from doing the cath. And assured them that sfaff would come to inform them of out come when they had finished. Daughters were tearful and expressed their fear and concern about whether their father would come through this. Pt wife does not speak english and daughters were supporting her.   I returned later and the cath had been completed with no blockage identified. Family had already spoken to physician and I told them that nurse would let them come back as soon as some test were completed. They were accepting of this and also wondering what was causing the kind of episode which lead to the cath and worrying about whether he would have another "heart attack."     Dyke Maes  [phone]

## 2013-05-11 NOTE — Progress Notes (Signed)
Subjective:  Patient remains intubated and sedated. Had multiple episodes of agitation last night and this morning noted to be bradycardic agonal and hypotensive received 1 mg atropine and one amp of epinephrine. Repeat EKG done showed sinus tachycardia with Q waves in inferior leads and further ST elevation in infiltrates with reciprocal changes in lead 1 aVL as compared to EKG done yesterday. Patient will be taken to cath lab emergently for relook angiogram and possible PCI.  Objective:  Vital Signs in the last 24 hours: Temp:  [94.3 F (34.6 C)-101.9 F (38.8 C)] 97 F (36.1 C) (08/27 0800) Pulse Rate:  [70-157] 104 (08/27 0920) Resp:  [14-31] 19 (08/27 0920) BP: (44-169)/(27-96) 148/96 mmHg (08/27 0915) SpO2:  [89 %-100 %] 98 % (08/27 0920) Arterial Line BP: (65-219)/(39-134) 219/134 mmHg (08/27 0830) FiO2 (%):  [40 %-100 %] 100 % (08/27 0920) Weight:  [72.5 kg (159 lb 13.3 oz)] 72.5 kg (159 lb 13.3 oz) (08/27 0432)  Intake/Output from previous day: 08/26 0701 - 08/27 0700 In: 2090.5 [I.V.:690.5; NG/GT:500; IV Piggyback:900] Out: 1585 [Urine:1585] Intake/Output from this shift: Total I/O In: 136.5 [I.V.:86.5; NG/GT:50] Out: 75 [Urine:75]  Physical Exam:   Lab Results:  Recent Labs  05/11/13 0421 05/11/13 0850  WBC 7.5 14.7*  HGB 10.2* 9.8*  PLT 233 237    Recent Labs  05/10/13 0409  05/10/13 1559 05/11/13 0421  NA 140  < > 144 141  K 3.1*  < > 4.9 4.2  CL 113*  < > 115* 113*  CO2 14*  --   --  17*  GLUCOSE 115*  < > 138* 145*  BUN 22  < > 19 23  CREATININE 0.94  < > 1.10 1.39*  < > = values in this interval not displayed.  Recent Labs  05/10/13 0409 05/11/13 0420  TROPONINI >20.00* >20.00*   Hepatic Function Panel  Recent Labs  05/11/13 0421  PROT 5.4*  ALBUMIN 2.2*  AST 234*  ALT 198*  ALKPHOS 53  BILITOT 0.3    Recent Labs  05/09/13 0530  CHOL 196   No results found for this basename: PROTIME,  in the last 72  hours  Imaging: Imaging results have been reviewed and Dg Chest Port 1 View  05/11/2013   *RADIOLOGY REPORT*  Clinical Data: Intubated patient.  PORTABLE CHEST - 1 VIEW  Comparison: Chest 05/09/2013 and 05/10/2013.  Findings: Support tubes and lines are unchanged and in good position.  No pneumothorax.  Right basilar atelectasis is not markedly changed.  There has been some increase in left basilar atelectasis.  There are likely small bilateral pleural effusions. No pneumothorax.  Cardiomegaly without edema noted.  IMPRESSION:  1.  Small bilateral pleural effusions and basilar atelectasis. Atelectasis on the left appears somewhat worsened. 2.  Support apparatus in good position.   Original Report Authenticated By: Holley Dexter, M.D.   Dg Chest Port 1 View  05/10/2013   *RADIOLOGY REPORT*  Clinical Data: Check endotracheal tube  PORTABLE CHEST - 1 VIEW  Comparison: Prior chest x-ray 08/08 09/19/2012  Findings: The endotracheal tube is 4 cm above the carina.  Stable position of left IJ approach central venous catheter with catheter tip in the distal SVC.  Nasogastric tube is present, the tip lies within the stomach.  External defibrillator pads project over the chest.  Slightly increased right greater than left pleural effusions. Stable cardiomegaly.  Background pulmonary vascular congestion is unchanged.  No pneumothorax.  No acute osseous abnormality.  IMPRESSION:  1.  Slightly increased right greater than left pleural effusions with associated bibasilar atelectasis. 2.  Stable and satisfactory support apparatus.   Original Report Authenticated By: Malachy Moan, M.D.    Cardiac Studies:  Assessment/Plan:  Status post acute inferoposterior wall myocardial infarction status post PCI 100% occluded RCA rule out subacute thrombosis and occlusion Status post V. fib cardiac arrest Status post hypotensive shock/metabolic acidosis/bradycardia/hypoxia New-onset diabetes mellitus Probable anoxic  encephalopathy Hypercholesteremia Plan Patient will be taken to cath lab emergently for relook angiogram possible PCI  LOS: 2 days    Laney Louderback N 05/11/2013, 10:00 AM

## 2013-05-11 NOTE — Progress Notes (Signed)
Pt extremely agitated. Pt attempting to pull out tubes. MD notified. No changes to sedation orders. PRN fentanyl administered. Propofol increased. Will continue to monitor closely.

## 2013-05-11 NOTE — Cardiovascular Report (Signed)
Dustin Vang, Dustin Vang                 ACCOUNT NO.:  [id number]  MEDICAL RECORD NO.:  [id number]  LOCATION:  2H05C                        FACILITY:  [facility]  PHYSICIAN:  Quaneshia Wareing N. Sharyn Lull, M.D. DATE OF BIRTH:  01/20/1956  DATE OF PROCEDURE:  05/11/2013 DATE OF DISCHARGE:                           CARDIAC CATHETERIZATION   PROCEDURE:  Left cardiac cath with selective right coronary angiography via left groin using Judkins technique.  INDICATION FOR THE PROCEDURE:  Mr. [name] is a 62 year old male with past medical history significant for hypertension, hypercholesteremia, remote tobacco abuse, was admitted on Feb 06, 2013, following cardiac arrest and was noted to have acute inferolateral wall myocardial infarction.  The patient had VFib, cardiac arrest at home, and subsequently was revived and brought to the hospital.  The patient emergently underwent left cardiac cath with selective left and right coronary angiography, and PCI to RCA on Feb 08, 2013, with excellent results with distal embolization to small PLV branch, but with TIMI grade 3 distal flow.  While in CCU the patient remained intubated and had multiple episodes of agitation last night, was started on propofol, subsequently on Precedex.  This morning the patient became hypotensive, bradycardiac, and acidotic and received atropine and epinephrine. Repeat EKG done this morning showed sinus tachycardia with Q-waves in the inferior leads and further ST elevation in the inferior leads with reciprocal changes in lead I, aVL as compared to EKG done yesterday. Due to new EKG changes, hypotension, the patient was brought to the cath lab emergently for relook angiogram possible PCI.  PROCEDURE NOTE:  The patient was brought to the cath lab and was placed on fluoroscopy table.  The left groin was prepped and draped in the usual fashion.  The patient had A-line in the left femoral artery, which was exchanged to 6-French sheath under  sterile condition.  Next, 6- French right Judkins catheter was advanced over the wire under fluoroscopic guidance up to the ascending aorta.  Wire was pulled out. The catheter was aspirated and connected to the Manifold.  Catheter was further advanced and engaged into right coronary ostium.  Multiple views of the right system were taken.  Next, catheter was disengaged and was pulled out over the wire.  Sheaths were aspirated and flushed.  FINDINGS:  RCA is widely patent in the stented portion, mid and distal portion, PLV branch remains distally occluded due to embolization as before.  The patient tolerated the procedure well.  There were no complications.  The patient was transferred back to CCU in stable condition.     [name]. Sharyn Lull, M.D.     MNH/MEDQ  D:  05/11/2013  T:  05/11/2013  Job:  [id number]

## 2013-05-11 NOTE — CV Procedure (Signed)
Cardiac cath report dictated on 05/11/2013 dictation number is [phone]

## 2013-05-11 NOTE — Progress Notes (Signed)
NUTRITION FOLLOW UP  Intervention:   1. Enteral nutrition; continue advancements of Vital 1.2 to 60 mL/hr goal to provide 1728 kcal, 108g protein, 1167 mL free water.   NUTRITION DIAGNOSIS:  Inadequate oral intake related to inability to eat as evidenced by NPO, intubated.   Monitor:  1. Enteral nutrition; initiation with tolerance. Pt to meet >/=90% estimated needs with nutrition support. Progressing, pt has reached 50 mL/hr 2. Wt/wt change; monitor trends.  Ongoing.   Assessment:   Pt admitted after cardiac arrest at home.  Now with possible anoxic brain injury. Patient is currently intubated on ventilator support.  MV: 17.8 L/min Temp:Temp (24hrs), Avg:99 F (37.2 C), Min:96.1 F (35.6 C), Max:101.9 F (38.8 C)  Pt initiated TFs and is tolerating well.  Pt has reached Vital 1.2 @ 50 mL/hr which provides 1440 kcal, 90g protein, 973 mL free water.  Gastric residuals:  40 mL this AM Note events of this morning. MVe elevated, however no adjustment to needs at this time.    Height: Ht Readings from Last 1 Encounters:  05/09/13 5\' 1"  (1.549 m)    Weight Status:   Wt Readings from Last 1 Encounters:  05/11/13 159 lb 13.3 oz (72.5 kg)    Re-estimated needs:  Kcal: 1707 Protein: 83-100 Fluid: >1.8 L/day  Skin: generalized  Diet Order: NPO   Intake/Output Summary (Last 24 hours) at 05/11/13 1248 Last data filed at 05/11/13 1218  Gross per 24 hour  Intake 1892.33 ml  Output   1705 ml  Net 187.33 ml    Last BM: PTA   Labs:   Recent Labs Lab 05/09/13 0824 05/09/13 1033  05/09/13 2100  05/10/13 0409  05/10/13 1559 05/11/13 0421 05/11/13 0850  NA 140 142  < > 138  < > 140  < > 144 141 153*  K 3.8 2.6*  < > 4.6  < > 3.1*  < > 4.9 4.2 4.5  CL 110 109  < > 112  < > 113*  < > 115* 113* 111  CO2  --  12*  < > 12*  < > 14*  --   --  17* 25  BUN 29* 31*  < > 25*  < > 22  < > 19 23 23   CREATININE 1.30 1.26  < > 1.08  < > 0.94  < > 1.10 1.39* 1.50*  CALCIUM  --   7.5*  < > 7.3*  < > 7.4*  --   --  7.1* 6.9*  MG  --  1.9  --  2.2  --   --   --   --   --  2.2  PHOS  --  1.0*  --   --   --   --   --   --   --  4.7*  GLUCOSE 549* 396*  < > 237*  < > 115*  < > 138* 145* 178*  < > = values in this interval not displayed.  CBG (last 3)   Recent Labs  05/11/13 0417 05/11/13 0728 05/11/13 1113  GLUCAP 142* 117* 197*    Scheduled Meds: . antiseptic oral rinse  15 mL Mouth Rinse QID  . aspirin EC  81 mg Oral Daily  . atorvastatin  80 mg Oral q1800  . cefTAZidime (FORTAZ)  IV  1 g Intravenous Q8H  . chlorhexidine  15 mL Mouth Rinse BID  . feeding supplement (VITAL AF 1.2 CAL)  1,000 mL Per Tube Q16H  .  heparin subcutaneous  5,000 Units Subcutaneous Q8H  . hydrocortisone sodium succinate  50 mg Intravenous Q6H  . pantoprazole (PROTONIX) IV  40 mg Intravenous Q24H  . Ticagrelor  90 mg Oral BID  . vancomycin  1,250 mg Intravenous Once  . vancomycin  750 mg Intravenous Q12H    Continuous Infusions: . sodium chloride 20 mL/hr at 05/11/13 1105  . dexmedetomidine 1.2 mcg/kg/hr (05/11/13 0722)  . insulin (NOVOLIN-R) infusion 2.9 Units/hr (05/10/13 0500)  . norepinephrine (LEVOPHED) Adult infusion 7.5 mcg/min (05/11/13 1218)    Loyce Dys, MS RD LDN Clinical Inpatient Dietitian Pager: [phone] Weekend/After hours pager: [phone]

## 2013-05-11 NOTE — Progress Notes (Signed)
Pt's family at bedside and pt very agitated. Family has been asked multiple times to decrease length of visits, limit visitors to two, and not try to communicate aggressively with patient. Upon entering room, pt was extremely agitated and had five family members in room and all were speaking loudly to patient. Pt was trying to get out of bed. One family member was videotaping the patient. RN asked family to stop, but they continued. Charge RN informed and spoke with family. Family agrees to limit visits, not speak directly to patient, and NO video or photography.

## 2013-05-11 NOTE — Progress Notes (Signed)
MD paged regarding pt agitation, and drop in BP. Propofol was increased, with a significant drop in blood pressure. Wert MD to place order for 500 cc bolus. Levophed increased. Will continue to monitor.

## 2013-05-12 ENCOUNTER — Encounter (HOSPITAL_COMMUNITY): Payer: Self-pay | Admitting: Cardiology

## 2013-05-12 ENCOUNTER — Inpatient Hospital Stay (HOSPITAL_COMMUNITY): Payer: Medicaid Other

## 2013-05-12 LAB — COMPREHENSIVE METABOLIC PANEL
ALT: 127 U/L — ABNORMAL HIGH (ref 0–53)
AST: 108 U/L — ABNORMAL HIGH (ref 0–37)
Albumin: 2.1 g/dL — ABNORMAL LOW (ref 3.5–5.2)
Alkaline Phosphatase: 61 U/L (ref 39–117)
Glucose, Bld: 269 mg/dL — ABNORMAL HIGH (ref 70–99)
Potassium: 3.4 mEq/L — ABNORMAL LOW (ref 3.5–5.1)
Sodium: 144 mEq/L (ref 135–145)
Total Protein: 5.4 g/dL — ABNORMAL LOW (ref 6.0–8.3)

## 2013-05-12 LAB — URINALYSIS, ROUTINE W REFLEX MICROSCOPIC
Glucose, UA: 500 mg/dL — AB
Ketones, ur: NEGATIVE mg/dL
Leukocytes, UA: NEGATIVE
Nitrite: NEGATIVE
Specific Gravity, Urine: 1.021 (ref 1.005–1.030)
pH: 5.5 (ref 5.0–8.0)

## 2013-05-12 LAB — URINE MICROSCOPIC-ADD ON

## 2013-05-12 LAB — POCT I-STAT 3, ART BLOOD GAS (G3+)
Acid-base deficit: 6 mmol/L — ABNORMAL HIGH (ref 0.0–2.0)
Bicarbonate: 16.4 mEq/L — ABNORMAL LOW (ref 20.0–24.0)
O2 Saturation: 98 %
TCO2: 17 mmol/L (ref 0–100)
pCO2 arterial: 21.5 mmHg — ABNORMAL LOW (ref 35.0–45.0)
pO2, Arterial: 97 mmHg (ref 80.0–100.0)

## 2013-05-12 LAB — CBC
Hemoglobin: 8.6 g/dL — ABNORMAL LOW (ref 13.0–17.0)
MCHC: 36.1 g/dL — ABNORMAL HIGH (ref 30.0–36.0)
Platelets: 228 10*3/uL (ref 150–400)
RDW: 14.2 % (ref 11.5–15.5)

## 2013-05-12 LAB — GLUCOSE, CAPILLARY
Glucose-Capillary: 210 mg/dL — ABNORMAL HIGH (ref 70–99)
Glucose-Capillary: 261 mg/dL — ABNORMAL HIGH (ref 70–99)
Glucose-Capillary: 264 mg/dL — ABNORMAL HIGH (ref 70–99)

## 2013-05-12 LAB — URINE CULTURE

## 2013-05-12 LAB — HEMOGLOBIN A1C
Hgb A1c MFr Bld: 8.2 % — ABNORMAL HIGH (ref <5.7)
Hgb A1c MFr Bld: 8.1 % — ABNORMAL HIGH (ref <5.7)

## 2013-05-12 MED ORDER — FUROSEMIDE 10 MG/ML IJ SOLN
40.0000 mg | Freq: Every day | INTRAMUSCULAR | Status: DC
  Administered 2013-05-12: 40 mg via INTRAVENOUS
  Filled 2013-05-12 (×2): qty 4

## 2013-05-12 MED ORDER — INSULIN ASPART 100 UNIT/ML ~~LOC~~ SOLN
0.0000 [IU] | SUBCUTANEOUS | Status: DC
  Administered 2013-05-12: 5 [IU] via SUBCUTANEOUS
  Administered 2013-05-12 (×2): 8 [IU] via SUBCUTANEOUS
  Administered 2013-05-12: 3 [IU] via SUBCUTANEOUS
  Administered 2013-05-12 – 2013-05-13 (×3): 5 [IU] via SUBCUTANEOUS
  Administered 2013-05-13: 8 [IU] via SUBCUTANEOUS
  Administered 2013-05-13: 3 [IU] via SUBCUTANEOUS
  Administered 2013-05-13 (×3): 5 [IU] via SUBCUTANEOUS
  Administered 2013-05-14 (×2): 3 [IU] via SUBCUTANEOUS
  Administered 2013-05-14: 2 [IU] via SUBCUTANEOUS
  Administered 2013-05-14: 3 [IU] via SUBCUTANEOUS
  Administered 2013-05-14: 8 [IU] via SUBCUTANEOUS
  Administered 2013-05-14: 3 [IU] via SUBCUTANEOUS
  Administered 2013-05-15: 2 [IU] via SUBCUTANEOUS
  Administered 2013-05-15: 3 [IU] via SUBCUTANEOUS
  Administered 2013-05-15: 2 [IU] via SUBCUTANEOUS

## 2013-05-12 MED ORDER — POTASSIUM CHLORIDE ER 10 MEQ PO TBCR: 20.0000 meq | EXTENDED_RELEASE_TABLET | Freq: Every day | ORAL | Status: DC

## 2013-05-12 MED ORDER — POTASSIUM CHLORIDE 20 MEQ/15ML (10%) PO LIQD
40.0000 meq | Freq: Once | ORAL | Status: AC
  Administered 2013-05-12: 40 meq
  Filled 2013-05-12: qty 30

## 2013-05-12 MED ORDER — RISPERIDONE 1 MG/ML PO SOLN
0.5000 mg | Freq: Two times a day (BID) | ORAL | Status: DC
  Administered 2013-05-12 (×2): 0.5 mg via ORAL
  Filled 2013-05-12 (×4): qty 0.5

## 2013-05-12 MED ORDER — POTASSIUM CHLORIDE 20 MEQ/15ML (10%) PO LIQD
20.0000 meq | Freq: Every day | ORAL | Status: DC
  Administered 2013-05-13 – 2013-05-14 (×2): 20 meq via ORAL
  Filled 2013-05-12: qty 15
  Filled 2013-05-12: qty 30
  Filled 2013-05-12: qty 15

## 2013-05-12 MED ORDER — SODIUM CHLORIDE 0.45 % IV SOLN
INTRAVENOUS | Status: DC
  Administered 2013-05-14: 04:00:00 via INTRAVENOUS

## 2013-05-12 NOTE — Progress Notes (Signed)
Inpatient Diabetes Program Recommendations  AACE/ADA: New Consensus Statement on Inpatient Glycemic Control (2013)  Target Ranges:  Prepandial:   less than 140 mg/dL      Peak postprandial:   less than 180 mg/dL (1-2 hours)      Critically ill patients:  140 - 180 mg/dL  Results for ELISA, SORLIE (MRN [id number]) as of 05/12/2013 12:05  Ref. Range 05/11/2013 19:41 05/12/2013 00:11 05/12/2013 04:03 05/12/2013 07:45  Glucose-Capillary Latest Range: 70-99 mg/dL 829 (H) 562 (H) 130 (H) 210 (H)   Inpatient Diabetes Program Recommendations Insulin - Basal: recommend Lantus 20 units daily Thank you  Piedad Climes BSN, RN,CDE Inpatient Diabetes Coordinator [phone] (team pager)

## 2013-05-12 NOTE — Progress Notes (Unsigned)
To Room 2905 to remove 6 fr sheath from LFA site. Pt STACH 100 with systolic BP 95 pre sheath removal. Suture removed from sheath, arteriotomy site prepped with betadine cleanse, left peripheral pulse palpated and noted prior to sheath removal, aspiration attempted from side arm of sheath- unable to aspirate heme, 6 fr sheath removed with approx 10 cc of bleed back from arteriotomy- noted small clot on gauze. Manual pressure held to LFA site for 15 min with hemostasis easily achieved. Vital signs remained in the 95 systolic, STACH range throughout manual hold.Post sheath removal hemostasis achieved, no hematoma, ecchymosis, or s/s of redness, edema, or drainage noted from arteriotomy site LFA. Distal DP on left remains intact. Pt remained intubated and sedated throughout sheath removal. Completion of sheath pull, Christian,RN at bedside and viewed LFA arteriotomy with satisfactory results. Pt left with HR 90s, bp108/70 Spo2 98%.

## 2013-05-12 NOTE — Progress Notes (Signed)
Subjective:  Patient remains intubated sedated. Left femoral sheath being pulled out.   Objective:  Vital Signs in the last 24 hours: Temp:  [96.1 F (35.6 C)-101.7 F (38.7 C)] 100.9 F (38.3 C) (08/28 0800) Pulse Rate:  [52-93] 84 (08/28 0900) Resp:  [18-30] 18 (08/28 0900) BP: (83-185)/(54-120) 95/73 mmHg (08/28 0700) SpO2:  [96 %-100 %] 96 % (08/28 0900) Arterial Line BP: (82-184)/(56-106) 104/64 mmHg (08/28 0900) FiO2 (%):  [40 %-100 %] 40 % (08/28 0900) Weight:  [74.7 kg (164 lb 10.9 oz)] 74.7 kg (164 lb 10.9 oz) (08/28 0454)  Intake/Output from previous day: 08/27 0701 - 08/28 0700 In: 3027.7 [I.V.:1057.7; NG/GT:1320; IV Piggyback:550] Out: 2065 [Urine:2065] Intake/Output from this shift: Total I/O In: 106.7 [I.V.:46.7; NG/GT:60] Out: 50 [Urine:50]  Physical Exam: Neck: no adenopathy, no carotid bruit, no JVD and supple, symmetrical, trachea midline Lungs: Decreased breath sound at bases with occasional rhonchi Heart: regular rate and rhythm, S1, S2 normal and Soft systolic murmur and S3 gallop noted Abdomen: soft, non-tender; bowel sounds normal; no masses,  no organomegaly  Lab Results:  Recent Labs  05/11/13 0850 05/12/13 0550  WBC 14.7* 8.2  HGB 9.8* 8.6*  PLT 237 228    Recent Labs  05/11/13 0850 05/12/13 0550  NA 153* 144  K 4.5 3.4*  CL 111 112  CO2 25 19  GLUCOSE 178* 269*  BUN 23 32*  CREATININE 1.50* 1.35    Recent Labs  05/11/13 0420 05/11/13 0850  TROPONINI >20.00* >20.00*   Hepatic Function Panel  Recent Labs  05/12/13 0550  PROT 5.4*  ALBUMIN 2.1*  AST 108*  ALT 127*  ALKPHOS 61  BILITOT 0.4   No results found for this basename: CHOL,  in the last 72 hours No results found for this basename: PROTIME,  in the last 72 hours  Imaging: Imaging results have been reviewed and Dg Chest Port 1 View  05/12/2013   *RADIOLOGY REPORT*  Clinical Data: Follow up atelectasis  PORTABLE CHEST - 1 VIEW  Comparison: 05/11/2013   Findings: Cardiomediastinal silhouette is stable.  Endotracheal tube in place with tip 3.6 cm above the carina.  Stable NG tube position.  Stable left IJ central line position.  Persistent left basilar atelectasis or infiltrate.  No pulmonary edema. Improvement in the right basilar atelectasis.  Probable trace right pleural effusion. No diagnostic pneumothorax.  IMPRESSION: Endotracheal tube in place with tip 3.6 cm above the carina. Stable NG tube position.  Stable left IJ central line position. Persistent left basilar atelectasis or infiltrate.  No pulmonary edema.  Improvement in the right basilar atelectasis.  Probable trace right pleural effusion.   Original Report Authenticated By: Natasha Mead, M.D.   Dg Chest Port 1 View  05/11/2013   *RADIOLOGY REPORT*  Clinical Data: Respiratory distress.  Status post cardiac catheterization.  PORTABLE CHEST - 1 VIEW  Comparison: Chest 05/10/2013.  Findings: Endotracheal tube, NG tube and left IJ catheter all remain in place, unchanged.  Small bilateral pleural effusions and basilar atelectasis are again seen.  Atelectatic change appears improved in the bases.  There is cardiomegaly.  No pulmonary edema or pneumothorax.  IMPRESSION:  1.  Support apparatus projects in good position. 2.  Small bilateral pleural effusions.  Bibasilar atelectasis appears decreased. 3.  No new abnormality.   Original Report Authenticated By: Holley Dexter, M.D.   Dg Chest Port 1 View  05/11/2013   *RADIOLOGY REPORT*  Clinical Data: Intubated patient.  PORTABLE CHEST - 1 VIEW  Comparison: Chest 05/09/2013 and 05/10/2013.  Findings: Support tubes and lines are unchanged and in good position.  No pneumothorax.  Right basilar atelectasis is not markedly changed.  There has been some increase in left basilar atelectasis.  There are likely small bilateral pleural effusions. No pneumothorax.  Cardiomegaly without edema noted.  IMPRESSION:  1.  Small bilateral pleural effusions and basilar  atelectasis. Atelectasis on the left appears somewhat worsened. 2.  Support apparatus in good position.   Original Report Authenticated By: Holley Dexter, M.D.    Cardiac Studies:  Assessment/Plan:  Acute inferoposterolateral wall myocardial infarction status post PCI 100% occluded large RCA with excellent results status post relook angiogram yesterday noted to have patent RCA with TIMI 3 distal flow Status post V. fib cardiac arrest  Acute respiratory failure secondary to above rule out aspiration  Probable aspiration pneumonia Acute anemia rule out GI loss Mild volume overload Status post Cardiogenic shock  Hypertension  New-onset diabetes mellitus  Hypercholesteremia  Remote tobacco abuse  Probable anoxic encephalopathy  Status post renal insufficiency improved  Status post hypokalemia  Plan Add Lasix as per orders  will add beta blockers and ACE inhibitors as blood pressure tolerates Check stool for occult blood Check labs in a.m.   LOS: 3 days    Kamryn Gauthier N 05/12/2013, 10:54 AM

## 2013-05-12 NOTE — Significant Event (Signed)
Hyperglycemia.  Will add SSI.  CBG (last 3)   Recent Labs  05/11/13 1113 05/11/13 1635 05/11/13 1941  GLUCAP 197* 138* 193*     Coralyn Helling, MD 05/12/2013, 12:15 AM

## 2013-05-12 NOTE — Progress Notes (Signed)
Sheath pulled by cath lab staff. During sheath pull, pt's sats decreased to 89%. Respiratory informed and vent FiO2 increased to 60%. Will wait on wean d/t increased FiO2

## 2013-05-12 NOTE — Progress Notes (Signed)
PULMONARY  / CRITICAL CARE MEDICINE  Name: Dustin Vang MRN: [id number] DOB: 01/21/1951    ADMISSION DATE:  05/09/2013  PRIMARY SERVICE: PCCM  CHIEF COMPLAINT:  Cardiac arrest, STEMI  BRIEF PATIENT DESCRIPTION:  62 years old male admitted after cardiac arrest at home. Initial rhythm at EMS arrival was V fib. Down time about 15 minutes. Return to spontaneous circulation after multiple shocks, epinephrine and amiodarone. EKG with inferolateral STEMI. Cath  100% rca stent.  SIGNIFICANT EVENTS / STUDIES:  EKG: Inferolateral STEMI 8/25- cath stent rca 8/25 hypothermia protocol 8/26- rewarm, followed commands, agitation 8/27- near arrest, epi, atropine, noted STEMI Inferior, rising pressors 8/27- cath emergent , stent fxn wnl 8/28- calmer, ventilation improved  LINES / TUBES: 8/24 ett>>> 8/25 rt ij>>> 8/24 rt rad>>>8/27 8/25 rt art Lavera Guise sheeths cath lab>>>8/25 8/27 A line left fem, replaced with cath sheeth>>>8/28  CULTURES: Sputum 8/26>>> BC 8/27>>> 8/27 urine>>> BAL bronch 8/27>>>  ANTIBIOTICS: unasyn 8/26>>>8/27 8/27 ceftaz>>> 8/27 vanc>>>  SUBJECTIVE: improved  VITAL SIGNS: Temp:  [96.1 F (35.6 C)-101.7 F (38.7 C)] 100.9 F (38.3 C) (08/28 0800) Pulse Rate:  [52-97] 77 (08/28 0800) Resp:  [27-30] 30 (08/28 0800) BP: (83-185)/(54-120) 95/73 mmHg (08/28 0700) SpO2:  [97 %-100 %] 97 % (08/28 0800) Arterial Line BP: (82-184)/(56-106) 95/59 mmHg (08/28 0800) FiO2 (%):  [40 %-100 %] 40 % (08/28 0840) Weight:  [74.7 kg (164 lb 10.9 oz)] 74.7 kg (164 lb 10.9 oz) (08/28 0454) HEMODYNAMICS: CVP:  [12 mmHg-15 mmHg] 12 mmHg VENTILATOR SETTINGS: Vent Mode:  [-] PRVC FiO2 (%):  [40 %-100 %] 40 % Set Rate:  [18 bmp-30 bmp] 18 bmp Vt Set:  [600 mL] 600 mL PEEP:  [5 cmH20] 5 cmH20 Plateau Pressure:  [25 cmH20-27 cmH20] 25 cmH20 INTAKE / OUTPUT: Intake/Output     08/27 0701 - 08/28 0700 08/28 0701 - 08/29 0700   I.V. (mL/kg) 1057.7 (14.2) 46.7 (0.6)   Other 100    NG/GT 1320 60   IV Piggyback 550    Total Intake(mL/kg) 3027.7 (40.5) 106.7 (1.4)   Urine (mL/kg/hr) 2065 (1.2) 50 (0.3)   Total Output 2065 50   Net +962.7 +56.7          PHYSICAL EXAMINATION: General: rass -1 Neuro:per rass -1, communicates with family HEENT: ett, thick neck PULM: rhonchi bilat, upper worse CV: s1 s2 RRR no r GI: soft, BS wnl, nt, nd Extremities: edema increased   LABS:  CBC Recent Labs     05/11/13  0421  05/11/13  0850  05/12/13  0550  WBC  7.5  14.7*  8.2  HGB  10.2*  9.8*  8.6*  HCT  29.0*  28.0*  23.8*  PLT  233  237  228   Coag's Recent Labs     05/09/13  1033  APTT  38*  INR  1.44   BMET Recent Labs     05/11/13  0421  05/11/13  0850  05/12/13  0550  NA  141  153*  144  K  4.2  4.5  3.4*  CL  113*  111  112  CO2  17*  25  19  BUN  23  23  32*  CREATININE  1.39*  1.50*  1.35  GLUCOSE  145*  178*  269*   Electrolytes Recent Labs     05/09/13  1033   05/09/13  2100   05/11/13  0421  05/11/13  0850  05/12/13  0550  CALCIUM  7.5*   < >  7.3*   < >  7.1*  6.9*  7.4*  MG  1.9   --   2.2   --    --   2.2  2.1  PHOS  1.0*   --    --    --    --   4.7*   --    < > = values in this interval not displayed.   Sepsis Markers No results found for this basename: LACTICACIDVEN, PROCALCITON, O2SATVEN,  in the last 72 hours ABG Recent Labs     05/11/13  0955  05/11/13  1555  05/12/13  0627  PHART  7.410  7.515*  7.496*  PCO2ART  25.3*  23.6*  21.5*  PO2ART  67.0*  168.0*  97.0   Liver Enzymes Recent Labs     05/11/13  0421  05/12/13  0550  AST  234*  108*  ALT  198*  127*  ALKPHOS  53  61  BILITOT  0.3  0.4  ALBUMIN  2.2*  2.1*   Cardiac Enzymes Recent Labs     05/10/13  0409  05/11/13  0420  05/11/13  0850  TROPONINI  >20.00*  >20.00*  >20.00*   Glucose Recent Labs     05/11/13  1113  05/11/13  1635  05/11/13  1941  05/12/13  0011  05/12/13  0403  05/12/13  0745  GLUCAP  197*  138*  193*  261*  264*   210*    Imaging Dg Chest Port 1 View  05/12/2013   *RADIOLOGY REPORT*  Clinical Data: Follow up atelectasis  PORTABLE CHEST - 1 VIEW  Comparison: 05/11/2013  Findings: Cardiomediastinal silhouette is stable.  Endotracheal tube in place with tip 3.6 cm above the carina.  Stable NG tube position.  Stable left IJ central line position.  Persistent left basilar atelectasis or infiltrate.  No pulmonary edema. Improvement in the right basilar atelectasis.  Probable trace right pleural effusion. No diagnostic pneumothorax.  IMPRESSION: Endotracheal tube in place with tip 3.6 cm above the carina. Stable NG tube position.  Stable left IJ central line position. Persistent left basilar atelectasis or infiltrate.  No pulmonary edema.  Improvement in the right basilar atelectasis.  Probable trace right pleural effusion.   Original Report Authenticated By: Natasha Mead, M.D.   Dg Chest Port 1 View  05/11/2013   *RADIOLOGY REPORT*  Clinical Data: Respiratory distress.  Status post cardiac catheterization.  PORTABLE CHEST - 1 VIEW  Comparison: Chest 05/10/2013.  Findings: Endotracheal tube, NG tube and left IJ catheter all remain in place, unchanged.  Small bilateral pleural effusions and basilar atelectasis are again seen.  Atelectatic change appears improved in the bases.  There is cardiomegaly.  No pulmonary edema or pneumothorax.  IMPRESSION:  1.  Support apparatus projects in good position. 2.  Small bilateral pleural effusions.  Bibasilar atelectasis appears decreased. 3.  No new abnormality.   Original Report Authenticated By: Holley Dexter, M.D.   Dg Chest Port 1 View  05/11/2013   *RADIOLOGY REPORT*  Clinical Data: Intubated patient.  PORTABLE CHEST - 1 VIEW  Comparison: Chest 05/09/2013 and 05/10/2013.  Findings: Support tubes and lines are unchanged and in good position.  No pneumothorax.  Right basilar atelectasis is not markedly changed.  There has been some increase in left basilar atelectasis.  There are  likely small bilateral pleural effusions. No pneumothorax.  Cardiomegaly without edema noted.  IMPRESSION:  1.  Small bilateral pleural effusions and basilar atelectasis.  Atelectasis on the left appears somewhat worsened. 2.  Support apparatus in good position.   Original Report Authenticated By: Holley Dexter, M.D.     CXR:  atx vs infiltrate LLL, ett wnl  ASSESSMENT / PLAN:  PULMONARY A: 1) Acute hypoxemic respiratory failure secondary to V fib arrest, combined met / resp  Acidosis 8/27 near arrest, STEMI re occurence P:   - abg this am reviewed, re reduce to rate 18 -dc aline -wean cpap5 ps 5 , goal 2 hrs -pcxr in am for LLL -upright position  CARDIOVASCULAR A:  1) V fib arrest 2) Inferolateral STEMI, stent rca 3) r/o vasospasm or intermittent resolved stent thrombosis 8/27 P:  -asa -goal to dc levo 5 mics -continue stress steroids -dc any further precedex, SE severe brady?,allergy added  RENAL A:   1) Acute renal failure, likely secondary to shock 2) small NON AG 3) Hypokalemia P:   -avoid saline -kvo 1/2 NS -cvp 12 -K supp  GASTROINTESTINAL A:   1) TF P:   - GI prophylaxis with protonix -lft follow up -ensure Tf back on  HEMATOLOGIC A: DVt prevention  1) sub q heparin Cbc in am   INFECTIOUS A:   1) r/o PNA P:   - continue vanc, ceftaz Await BAL final  ENDOCRINE A:   1) No known history of DM, rel AI P:   - ssi -steroids, reduce when off pressors -lantus may be needed  NEUROLOGIC A:   1)s/p arrest, followed commands, encephalopathy, delirium P:   - fent drip likely best for now -versed required -consider addition Risperdal addition, if qtc less 500  TODAY'S SUMMARY: Improved, precedex listed allergy, weaning, goal to dc levo  I have personally obtained a history, examined the patient, evaluated laboratory and imaging results, formulated the assessment and plan and placed orders. CRITICAL CARE: The patient is critically ill with  multiple organ systems failure and requires high complexity decision making for assessment and support, frequent evaluation and titration of therapies, application of advanced monitoring technologies and extensive interpretation of multiple databases. Critical Care Time devoted to patient care services described in this note is 35 minutes.   [name]. [name], MD, FACP Pgr: [phone] [facility]

## 2013-05-13 ENCOUNTER — Inpatient Hospital Stay (HOSPITAL_COMMUNITY): Payer: Medicaid Other

## 2013-05-13 ENCOUNTER — Encounter (HOSPITAL_COMMUNITY): Payer: Self-pay | Admitting: *Deleted

## 2013-05-13 LAB — CULTURE, RESPIRATORY W GRAM STAIN

## 2013-05-13 LAB — CBC WITH DIFFERENTIAL/PLATELET
Basophils Absolute: 0 10*3/uL (ref 0.0–0.1)
Basophils Relative: 0 % (ref 0–1)
Eosinophils Absolute: 0 10*3/uL (ref 0.0–0.7)
HCT: 23.6 % — ABNORMAL LOW (ref 39.0–52.0)
Hemoglobin: 8.1 g/dL — ABNORMAL LOW (ref 13.0–17.0)
MCH: 30.6 pg (ref 26.0–34.0)
MCHC: 34.3 g/dL (ref 30.0–36.0)
Monocytes Absolute: 0.7 10*3/uL (ref 0.1–1.0)
Monocytes Relative: 7 % (ref 3–12)
RDW: 14.6 % (ref 11.5–15.5)

## 2013-05-13 LAB — GLUCOSE, CAPILLARY
Glucose-Capillary: 222 mg/dL — ABNORMAL HIGH (ref 70–99)
Glucose-Capillary: 226 mg/dL — ABNORMAL HIGH (ref 70–99)

## 2013-05-13 LAB — BASIC METABOLIC PANEL
BUN: 35 mg/dL — ABNORMAL HIGH (ref 6–23)
Chloride: 111 mEq/L (ref 96–112)
Creatinine, Ser: 1.51 mg/dL — ABNORMAL HIGH (ref 0.50–1.35)
GFR calc Af Amer: 56 mL/min — ABNORMAL LOW (ref >90)
GFR calc non Af Amer: 48 mL/min — ABNORMAL LOW (ref >90)
Glucose, Bld: 271 mg/dL — ABNORMAL HIGH (ref 70–99)
Potassium: 3.3 mEq/L — ABNORMAL LOW (ref 3.5–5.1)

## 2013-05-13 MED ORDER — RISPERIDONE 1 MG/ML PO SOLN
1.0000 mg | Freq: Two times a day (BID) | ORAL | Status: DC
  Filled 2013-05-13 (×2): qty 1

## 2013-05-13 MED ORDER — HYDROCORTISONE SOD SUCCINATE 100 MG IJ SOLR
25.0000 mg | Freq: Four times a day (QID) | INTRAMUSCULAR | Status: DC
  Administered 2013-05-13 – 2013-05-14 (×4): 25 mg via INTRAVENOUS
  Filled 2013-05-13 (×7): qty 0.5

## 2013-05-13 MED ORDER — FUROSEMIDE 10 MG/ML IJ SOLN
INTRAMUSCULAR | Status: AC
  Administered 2013-05-13: 60 mg via INTRAVENOUS
  Filled 2013-05-13: qty 4

## 2013-05-13 MED ORDER — NOREPINEPHRINE BITARTRATE 1 MG/ML IJ SOLN: 2.0000 ug/min | INTRAVENOUS | Status: DC

## 2013-05-13 MED ORDER — FUROSEMIDE 10 MG/ML IJ SOLN
60.0000 mg | Freq: Two times a day (BID) | INTRAMUSCULAR | Status: DC
  Administered 2013-05-13 – 2013-05-14 (×3): 60 mg via INTRAVENOUS
  Filled 2013-05-13 (×3): qty 6

## 2013-05-13 MED ORDER — RISPERIDONE 1 MG/ML PO SOLN
1.0000 mg | Freq: Two times a day (BID) | ORAL | Status: DC
  Administered 2013-05-13 (×2): 1 mg via ORAL
  Filled 2013-05-13 (×4): qty 1

## 2013-05-13 MED ORDER — INSULIN GLARGINE 100 UNIT/ML ~~LOC~~ SOLN
5.0000 [IU] | Freq: Every day | SUBCUTANEOUS | Status: DC
  Administered 2013-05-13 – 2013-05-14 (×2): 5 [IU] via SUBCUTANEOUS
  Filled 2013-05-13 (×3): qty 0.05

## 2013-05-13 MED ORDER — POTASSIUM CHLORIDE 20 MEQ/15ML (10%) PO LIQD
40.0000 meq | Freq: Once | ORAL | Status: AC
  Administered 2013-05-13: 40 meq

## 2013-05-13 MED ORDER — RISPERIDONE 1 MG/ML PO SOLN
1.0000 mg | Freq: Two times a day (BID) | ORAL | Status: DC
  Filled 2013-05-13: qty 1

## 2013-05-13 NOTE — Progress Notes (Signed)
eLink Physician-Brief Progress Note Patient Name: Arnell Slivinski DOB: 16-Jun-1951 MRN: [id number]  Date of Service  05/13/2013   HPI/Events of Note   Hypokalemia  eICU Interventions  Potassium replaced   Intervention Category Intermediate Interventions: Electrolyte abnormality - evaluation and management  DETERDING,ELIZABETH 05/13/2013, 4:54 AM

## 2013-05-13 NOTE — Progress Notes (Signed)
Dr. [name] notified of decrease in blood pressure since lasix given.  Orders received to restart levophed.  Will continue to monitor pt closely.

## 2013-05-13 NOTE — Progress Notes (Signed)
PULMONARY  / CRITICAL CARE MEDICINE  Name: Dustin Vang MRN: [id number] DOB: 20-Nov-1950    ADMISSION DATE:  05/09/2013  PRIMARY SERVICE: PCCM  CHIEF COMPLAINT:  Cardiac arrest, STEMI  BRIEF PATIENT DESCRIPTION:  62 years old male admitted after cardiac arrest at home. Initial rhythm at EMS arrival was V fib. Down time about 15 minutes. Return to spontaneous circulation after multiple shocks, epinephrine and amiodarone. EKG with inferolateral STEMI. Cath  100% rca stent.  SIGNIFICANT EVENTS / STUDIES:  EKG: Inferolateral STEMI 8/25- cath stent rca 8/25 hypothermia protocol 8/26- rewarm, followed commands, agitation 8/27- near arrest, epi, atropine, noted STEMI Inferior, rising pressors 8/27- cath emergent , stent fxn wnl 8/28- calmer, ventilation improved  LINES / TUBES: 8/24 ett>>> 8/25 rt ij>>> 8/24 rt rad>>>8/27 8/25 rt art Lavera Guise sheeths cath lab>>>8/25 8/27 A line left fem, replaced with cath sheeth>>>8/28  CULTURES: Sputum 8/26>>>Kleb pna, kleb oxytoca, h para = multiple bacteria BC 8/27>>> 8/27 urine>>> BAL bronch 8/27>>>  ANTIBIOTICS: unasyn 8/26>>>8/27 8/27 ceftaz>>> 8/27 vanc>>>  SUBJECTIVE:temp improved, weaned 15 min then failed today  VITAL SIGNS: Temp:  [99.4 F (37.4 C)-100.9 F (38.3 C)] 99.4 F (37.4 C) (08/29 0726) Pulse Rate:  [73-128] 106 (08/29 0930) Resp:  [13-24] 18 (08/29 0930) BP: (78-216)/(43-131) 143/90 mmHg (08/29 0930) SpO2:  [93 %-100 %] 94 % (08/29 0930) FiO2 (%):  [40 %-60 %] 40 % (08/29 0900) Weight:  [75.3 kg (166 lb 0.1 oz)] 75.3 kg (166 lb 0.1 oz) (08/29 0445) HEMODYNAMICS: CVP:  [9 mmHg-14 mmHg] 14 mmHg VENTILATOR SETTINGS: Vent Mode:  [-] PRVC FiO2 (%):  [40 %-60 %] 40 % Set Rate:  [18 bmp] 18 bmp Vt Set:  [600 mL] 600 mL PEEP:  [5 cmH20] 5 cmH20 Pressure Support:  [5 cmH20] 5 cmH20 Plateau Pressure:  [22 cmH20-26 cmH20] 22 cmH20 INTAKE / OUTPUT: Intake/Output     08/28 0701 - 08/29 0700 08/29 0701 - 08/30 0700   I.V. (mL/kg) 1173.5 (15.6) 51.5 (0.7)   Other     NG/GT 1630 60   IV Piggyback 450    Total Intake(mL/kg) 3253.5 (43.2) 111.5 (1.5)   Urine (mL/kg/hr) 2955 (1.6) 250 (1)   Stool 1 (0)    Total Output 2956 250   Net +297.5 -138.5          PHYSICAL EXAMINATION: General: rass 0 Neuro:per rass 0 HEENT: ett, thick neck PULM: cta anterior CV: s1 s2 RRR no r GI: soft, BS wnl, nt, nd Extremities: edema increased   LABS:  CBC Recent Labs     05/11/13  0850  05/12/13  0550  05/13/13  0413  WBC  14.7*  8.2  10.4  HGB  9.8*  8.6*  8.1*  HCT  28.0*  23.8*  23.6*  PLT  237  228  211   Coag's No results found for this basename: APTT, INR,  in the last 72 hours BMET Recent Labs     05/11/13  0850  05/12/13  0550  05/13/13  0413  NA  153*  144  144  K  4.5  3.4*  3.3*  CL  111  112  111  CO2  25  19  24   BUN  23  32*  35*  CREATININE  1.50*  1.35  1.51*  GLUCOSE  178*  269*  271*   Electrolytes Recent Labs     05/11/13  0850  05/12/13  0550  05/13/13  0413  CALCIUM  6.9*  7.4*  7.4*  MG  2.2  2.1   --   PHOS  4.7*   --    --    Sepsis Markers No results found for this basename: LACTICACIDVEN, PROCALCITON, O2SATVEN,  in the last 72 hours ABG Recent Labs     05/11/13  0955  05/11/13  1555  05/12/13  0627  PHART  7.410  7.515*  7.496*  PCO2ART  25.3*  23.6*  21.5*  PO2ART  67.0*  168.0*  97.0   Liver Enzymes Recent Labs     05/11/13  0421  05/12/13  0550  AST  234*  108*  ALT  198*  127*  ALKPHOS  53  61  BILITOT  0.3  0.4  ALBUMIN  2.2*  2.1*   Cardiac Enzymes Recent Labs     05/11/13  0420  05/11/13  0850  TROPONINI  >20.00*  >20.00*   Glucose Recent Labs     05/12/13  1204  05/12/13  1628  05/12/13  1947  05/13/13  0008  05/13/13  0359  05/13/13  0730  GLUCAP  210*  196*  246*  253*  249*  222*    Imaging Dg Chest Port 1 View  05/13/2013   *RADIOLOGY REPORT*  Clinical Data: Assess left lower lobe, follow-up  PORTABLE CHEST - 1  VIEW  Comparison: Portable exam 0539 hours compared to 05/12/2013  Findings: Tip of endotracheal tube projects 3.4 cm above carina. Nasogastric tube within stomach. Left jugular central venous catheter tip projects over distal SVC. Enlargement of cardiac silhouette. Stable mediastinal contours and pulmonary vascularity with tortuosity of thoracic aorta noted. Minimal atelectasis at right base. Persistent left basilar opacity question atelectasis versus infiltrate, little changed. No pleural effusion or pneumothorax. Bones unremarkable.  IMPRESSION: Enlargement of cardiac silhouette. Minimal right basilar atelectasis. Persistent left lower lobe atelectasis versus consolidation.   Original Report Authenticated By: Ulyses Southward, M.D.   Dg Chest Port 1 View  05/12/2013   *RADIOLOGY REPORT*  Clinical Data: Follow up atelectasis  PORTABLE CHEST - 1 VIEW  Comparison: 05/11/2013  Findings: Cardiomediastinal silhouette is stable.  Endotracheal tube in place with tip 3.6 cm above the carina.  Stable NG tube position.  Stable left IJ central line position.  Persistent left basilar atelectasis or infiltrate.  No pulmonary edema. Improvement in the right basilar atelectasis.  Probable trace right pleural effusion. No diagnostic pneumothorax.  IMPRESSION: Endotracheal tube in place with tip 3.6 cm above the carina. Stable NG tube position.  Stable left IJ central line position. Persistent left basilar atelectasis or infiltrate.  No pulmonary edema.  Improvement in the right basilar atelectasis.  Probable trace right pleural effusion.   Original Report Authenticated By: Natasha Mead, M.D.   Dg Chest Port 1 View  05/11/2013   *RADIOLOGY REPORT*  Clinical Data: Respiratory distress.  Status post cardiac catheterization.  PORTABLE CHEST - 1 VIEW  Comparison: Chest 05/10/2013.  Findings: Endotracheal tube, NG tube and left IJ catheter all remain in place, unchanged.  Small bilateral pleural effusions and basilar atelectasis are again  seen.  Atelectatic change appears improved in the bases.  There is cardiomegaly.  No pulmonary edema or pneumothorax.  IMPRESSION:  1.  Support apparatus projects in good position. 2.  Small bilateral pleural effusions.  Bibasilar atelectasis appears decreased. 3.  No new abnormality.   Original Report Authenticated By: Holley Dexter, M.D.     CXR:  bibasilr atx, line wnl  ASSESSMENT / PLAN:  PULMONARY A: 1)  Acute hypoxemic respiratory failure secondary to V fib arrest, combined met / resp  Acidosis 8/27 near arrest with brady and ST elevation, cath neg P:   - wean this am , failed after 15 min, repeat in afternoon with escalation PS 10-12 -goal 1 hr weaning -consider neg balance, lasix -control agitation -may require trach ? With combination neuro agitation, fragile cardiac status  CARDIOVASCULAR A:  1) V fib arrest 2) Inferolateral STEMI, stent rca 3) r/o vasospasm or intermittent resolved stent thrombosis 8/27 P:  -asa -Now off pressors can reduce stress steroids -dc any further precedex, SE severe brady?,allergy added  RENAL A:   1) Acute renal failure, likely secondary to shock 2) small NON AG 3) Hypokalemia P:   -kvo -lasix as off pressors -K supp  GASTROINTESTINAL A:   1) TF, heme pos stools P:   - GI prophylaxis with protonix -Tf  HEMATOLOGIC A: DVt prevention  1) sub q heparin  Cbc in am No overt bleeding noted Risk / benefit ratio supports continued asa Follow hgb  INFECTIOUS A:   1) r/o PNA P:   - continue vanc, ceftaz -mulitple bacteria leads me to believe contam -in am consider change to monotherapy ceftriaxone  ENDOCRINE A:   1) No known history of DM, rel AI P:   - ssi -steroids, reduce when off pressors, today to 25 mg  NEUROLOGIC A:   1)s/p arrest, followed commands, encephalopathy, delirium P:   - fent drip likely best for now -versed required -add low dose lantus -Increase Risperdal  TODAY'S SUMMARY: Improved,  precedex listed allergy, weaning, goal to dc levo  I have personally obtained a history, examined the patient, evaluated laboratory and imaging results, formulated the assessment and plan and placed orders. CRITICAL CARE: The patient is critically ill with multiple organ systems failure and requires high complexity decision making for assessment and support, frequent evaluation and titration of therapies, application of advanced monitoring technologies and extensive interpretation of multiple databases. Critical Care Time devoted to patient care services described in this note is 35 minutes.   [name]. [name], MD, FACP Pgr: [phone] [facility]

## 2013-05-13 NOTE — Progress Notes (Signed)
Subjective:  Patient more awake and alert remains intubated. Off pressors  Objective:  Vital Signs in the last 24 hours: Temp:  [99.4 F (37.4 C)-100.9 F (38.3 C)] 99.4 F (37.4 C) (08/29 0726) Pulse Rate:  [73-107] 80 (08/29 0726) Resp:  [18-24] 18 (08/29 0726) BP: (78-197)/(43-104) 105/65 mmHg (08/29 0726) SpO2:  [93 %-100 %] 96 % (08/29 0726) Arterial Line BP: (96-104)/(62-64) 96/62 mmHg (08/28 1000) FiO2 (%):  [40 %-60 %] 40 % (08/29 0400) Weight:  [75.3 kg (166 lb 0.1 oz)] 75.3 kg (166 lb 0.1 oz) (08/29 0445)  Intake/Output from previous day: 08/28 0701 - 08/29 0700 In: 3253.5 [I.V.:1173.5; NG/GT:1630; IV Piggyback:450] Out: 2956 [Urine:2955; Stool:1] Intake/Output from this shift:    Physical Exam: Neck: no adenopathy, no carotid bruit, no JVD and supple, symmetrical, trachea midline Lungs: Decreased breath sound at bases with occasional rhonchi Heart: regular rate and rhythm, S1, S2 normal and Soft systolic murmur noted Abdomen: soft, non-tender; bowel sounds normal; no masses,  no organomegaly Extremities: extremities normal, atraumatic, no cyanosis or edema  Lab Results:  Recent Labs  05/12/13 0550 05/13/13 0413  WBC 8.2 10.4  HGB 8.6* 8.1*  PLT 228 211    Recent Labs  05/12/13 0550 05/13/13 0413  NA 144 144  K 3.4* 3.3*  CL 112 111  CO2 19 24  GLUCOSE 269* 271*  BUN 32* 35*  CREATININE 1.35 1.51*    Recent Labs  05/11/13 0420 05/11/13 0850  TROPONINI >20.00* >20.00*   Hepatic Function Panel  Recent Labs  05/12/13 0550  PROT 5.4*  ALBUMIN 2.1*  AST 108*  ALT 127*  ALKPHOS 61  BILITOT 0.4   No results found for this basename: CHOL,  in the last 72 hours No results found for this basename: PROTIME,  in the last 72 hours  Imaging: Imaging results have been reviewed and Dg Chest Port 1 View  05/13/2013   *RADIOLOGY REPORT*  Clinical Data: Assess left lower lobe, follow-up  PORTABLE CHEST - 1 VIEW  Comparison: Portable exam 0539 hours  compared to 05/12/2013  Findings: Tip of endotracheal tube projects 3.4 cm above carina. Nasogastric tube within stomach. Left jugular central venous catheter tip projects over distal SVC. Enlargement of cardiac silhouette. Stable mediastinal contours and pulmonary vascularity with tortuosity of thoracic aorta noted. Minimal atelectasis at right base. Persistent left basilar opacity question atelectasis versus infiltrate, little changed. No pleural effusion or pneumothorax. Bones unremarkable.  IMPRESSION: Enlargement of cardiac silhouette. Minimal right basilar atelectasis. Persistent left lower lobe atelectasis versus consolidation.   Original Report Authenticated By: Ulyses Southward, M.D.   Dg Chest Port 1 View  05/12/2013   *RADIOLOGY REPORT*  Clinical Data: Follow up atelectasis  PORTABLE CHEST - 1 VIEW  Comparison: 05/11/2013  Findings: Cardiomediastinal silhouette is stable.  Endotracheal tube in place with tip 3.6 cm above the carina.  Stable NG tube position.  Stable left IJ central line position.  Persistent left basilar atelectasis or infiltrate.  No pulmonary edema. Improvement in the right basilar atelectasis.  Probable trace right pleural effusion. No diagnostic pneumothorax.  IMPRESSION: Endotracheal tube in place with tip 3.6 cm above the carina. Stable NG tube position.  Stable left IJ central line position. Persistent left basilar atelectasis or infiltrate.  No pulmonary edema.  Improvement in the right basilar atelectasis.  Probable trace right pleural effusion.   Original Report Authenticated By: Natasha Mead, M.D.   Dg Chest Port 1 View  05/11/2013   *RADIOLOGY REPORT*  Clinical  Data: Respiratory distress.  Status post cardiac catheterization.  PORTABLE CHEST - 1 VIEW  Comparison: Chest 05/10/2013.  Findings: Endotracheal tube, NG tube and left IJ catheter all remain in place, unchanged.  Small bilateral pleural effusions and basilar atelectasis are again seen.  Atelectatic change appears improved  in the bases.  There is cardiomegaly.  No pulmonary edema or pneumothorax.  IMPRESSION:  1.  Support apparatus projects in good position. 2.  Small bilateral pleural effusions.  Bibasilar atelectasis appears decreased. 3.  No new abnormality.   Original Report Authenticated By: Holley Dexter, M.D.    Cardiac Studies:  Assessment/Plan:  Acute inferoposterolateral wall myocardial infarction status post PCI 100% occluded large RCA with excellent results status post relook angiogram yesterday noted to have patent RCA with TIMI 3 distal flow  Status post V. fib cardiac arrest  Acute respiratory failure secondary to above rule out aspiration  Probable aspiration pneumonia  Acute anemia rule out GI loss  Mild volume overload  Status post Cardiogenic shock  Hypertension  New-onset diabetes mellitus  Hypercholesteremia  Remote tobacco abuse  Probable anoxic encephalopathy  Status post renal insufficiency improved  Status post hypokalemia  Plan Acute inferoposterolateral wall myocardial infarction status post PCI 100% occluded large RCA with excellent results status post relook angiogram on 05/11/2013 noted to have patent RCA with TIMI 3 distal flow  Status post V. fib cardiac arrest  Acute respiratory failure secondary to above rule out aspiration  Probable aspiration pneumonia  Acute anemia rule out GI loss  Mild volume overload  Status post Cardiogenic shock  Hypertension  New-onset diabetes mellitus  Hypercholesteremia  Remote tobacco abuse  Probable anoxic encephalopathy  Status post renal insufficiency improved  Status post hypokalemia  Mild renal insufficiency secondary to contrast/hypotension Plan Follow CBC and hemoglobin below 8 consider packed RBC transfusion in view of of large MI. Consider weaning of steroids in view of recent MI Dr. Algie Coffer on-call for weekend  LOS: 4 days    [facility] N 05/13/2013, 8:38 AM

## 2013-05-13 NOTE — Progress Notes (Signed)
ANTIBIOTIC CONSULT NOTE - FOLLOW UP  Pharmacy Consult for fortaz, vancomycin Indication: rule out pneumonia  Allergies  Allergen Reactions  . Precedex [Dexmedetomidine Hcl In Nacl]     Bradycardia/arrest    Patient Measurements: Height: 5\' 1"  (154.9 cm) Weight: 166 lb 0.1 oz (75.3 kg) IBW/kg (Calculated) : 52.3  Vital Signs: Temp: 99.4 F (37.4 C) (08/29 0726) Temp src: Oral (08/29 0726) BP: 138/54 mmHg (08/29 0844) Pulse Rate: 115 (08/29 0844) Intake/Output from previous day: 08/28 0701 - 08/29 0700 In: 3253.5 [I.V.:1173.5; NG/GT:1630; IV Piggyback:450] Out: 2956 [Urine:2955; Stool:1] Intake/Output from this shift:    Labs:  Recent Labs  05/11/13 0850 05/12/13 0550 05/13/13 0413  WBC 14.7* 8.2 10.4  HGB 9.8* 8.6* 8.1*  PLT 237 228 211  CREATININE 1.50* 1.35 1.51*   Estimated Creatinine Clearance: 44.7 ml/min (by C-G formula based on Cr of 1.51). No results found for this basename: VANCOTROUGH, VANCOPEAK, VANCORANDOM, GENTTROUGH, GENTPEAK, GENTRANDOM, TOBRATROUGH, TOBRAPEAK, TOBRARND, AMIKACINPEAK, AMIKACINTROU, AMIKACIN,  in the last 72 hours     Assessment: 62 yo male with PNA on vancomycin and fortaz. Repiratory cultures show klebsiella Pn (Pan-S) and Klebsiells Oxy (R to ampicillin). WBC= 10.4, tmax= 100.9, SCr= 1.51 and CrCl ~45.  Unasyn 8/26 >> 8/27 Vanc 8/27 >> Elita Quick 8/27  8/26 RCx >> kleb pn (pan-S) and kleb oxy (R to amp) 8/27 UCx >> NG 8/27 BAL >> pending (rare GNR on stain) 8/27 BCx >> ngtd   Goal of Therapy:  Vancomycin trough level 15-20 mcg/ml  Plan:  -Could consider streamline antibiotics to ancef -No vancomycin levels now but will consider if therapy continues -Will follow renal function, cultures and clinical progress  Harland German, Pharm D 05/13/2013 9:33 AM

## 2013-05-14 ENCOUNTER — Inpatient Hospital Stay (HOSPITAL_COMMUNITY): Payer: Medicaid Other

## 2013-05-14 DIAGNOSIS — J9601 Acute respiratory failure with hypoxia: Secondary | ICD-10-CM

## 2013-05-14 DIAGNOSIS — I498 Other specified cardiac arrhythmias: Secondary | ICD-10-CM

## 2013-05-14 DIAGNOSIS — I219 Acute myocardial infarction, unspecified: Secondary | ICD-10-CM

## 2013-05-14 DIAGNOSIS — J9600 Acute respiratory failure, unspecified whether with hypoxia or hypercapnia: Secondary | ICD-10-CM

## 2013-05-14 DIAGNOSIS — Z9911 Dependence on respirator [ventilator] status: Secondary | ICD-10-CM

## 2013-05-14 DIAGNOSIS — G9340 Encephalopathy, unspecified: Secondary | ICD-10-CM

## 2013-05-14 DIAGNOSIS — I10 Essential (primary) hypertension: Secondary | ICD-10-CM

## 2013-05-14 DIAGNOSIS — R000 Tachycardia, unspecified: Secondary | ICD-10-CM

## 2013-05-14 LAB — CBC WITH DIFFERENTIAL/PLATELET
Basophils Absolute: 0 10*3/uL (ref 0.0–0.1)
Eosinophils Relative: 0 % (ref 0–5)
HCT: 25.2 % — ABNORMAL LOW (ref 39.0–52.0)
Hemoglobin: 8.8 g/dL — ABNORMAL LOW (ref 13.0–17.0)
Lymphocytes Relative: 11 % — ABNORMAL LOW (ref 12–46)
Lymphs Abs: 1.2 10*3/uL (ref 0.7–4.0)
MCV: 88.7 fL (ref 78.0–100.0)
Monocytes Absolute: 1.1 10*3/uL — ABNORMAL HIGH (ref 0.1–1.0)
Monocytes Relative: 10 % (ref 3–12)
Neutro Abs: 8.8 10*3/uL — ABNORMAL HIGH (ref 1.7–7.7)
RBC: 2.84 MIL/uL — ABNORMAL LOW (ref 4.22–5.81)
WBC: 11.1 10*3/uL — ABNORMAL HIGH (ref 4.0–10.5)

## 2013-05-14 LAB — GLUCOSE, CAPILLARY
Glucose-Capillary: 182 mg/dL — ABNORMAL HIGH (ref 70–99)
Glucose-Capillary: 196 mg/dL — ABNORMAL HIGH (ref 70–99)
Glucose-Capillary: 197 mg/dL — ABNORMAL HIGH (ref 70–99)
Glucose-Capillary: 283 mg/dL — ABNORMAL HIGH (ref 70–99)

## 2013-05-14 LAB — CULTURE, RESPIRATORY W GRAM STAIN

## 2013-05-14 LAB — BASIC METABOLIC PANEL
CO2: 28 mEq/L (ref 19–32)
Chloride: 105 mEq/L (ref 96–112)
GFR calc Af Amer: 59 mL/min — ABNORMAL LOW (ref >90)
Potassium: 3 mEq/L — ABNORMAL LOW (ref 3.5–5.1)

## 2013-05-14 MED ORDER — VITAL AF 1.2 CAL PO LIQD
1000.0000 mL | ORAL | Status: DC
  Filled 2013-05-14 (×2): qty 1000

## 2013-05-14 MED ORDER — HALOPERIDOL LACTATE 5 MG/ML IJ SOLN
INTRAMUSCULAR | Status: AC
  Administered 2013-05-14: 2 mg via INTRAVENOUS
  Filled 2013-05-14: qty 1

## 2013-05-14 MED ORDER — FENTANYL BOLUS VIA INFUSION
25.0000 ug | Freq: Four times a day (QID) | INTRAVENOUS | Status: DC | PRN
  Administered 2013-05-14 – 2013-05-15 (×4): 50 ug via INTRAVENOUS
  Filled 2013-05-14: qty 50

## 2013-05-14 MED ORDER — NOREPINEPHRINE BITARTRATE 1 MG/ML IJ SOLN: 2.0000 ug/min | INTRAVENOUS | Status: DC

## 2013-05-14 MED ORDER — PANTOPRAZOLE SODIUM 40 MG PO PACK
40.0000 mg | PACK | Freq: Every day | ORAL | Status: DC
  Filled 2013-05-14: qty 20

## 2013-05-14 MED ORDER — MIDAZOLAM HCL 2 MG/2ML IJ SOLN
2.0000 mg | Freq: Once | INTRAMUSCULAR | Status: AC
  Administered 2013-05-14: 2 mg via INTRAVENOUS

## 2013-05-14 MED ORDER — POTASSIUM CHLORIDE 20 MEQ/15ML (10%) PO LIQD
20.0000 meq | Freq: Once | ORAL | Status: AC
  Administered 2013-05-14: 20 meq
  Filled 2013-05-14: qty 15

## 2013-05-14 MED ORDER — METOPROLOL TARTRATE 1 MG/ML IV SOLN
2.5000 mg | INTRAVENOUS | Status: DC | PRN
  Administered 2013-05-14: 5 mg via INTRAVENOUS
  Administered 2013-05-14: 2.5 mg via INTRAVENOUS
  Administered 2013-05-14: 5 mg via INTRAVENOUS
  Administered 2013-05-16: 2.5 mg via INTRAVENOUS
  Filled 2013-05-14 (×5): qty 5

## 2013-05-14 MED ORDER — MIDAZOLAM HCL 2 MG/2ML IJ SOLN
2.0000 mg | INTRAMUSCULAR | Status: DC | PRN
  Administered 2013-05-14 – 2013-05-15 (×9): 2 mg via INTRAVENOUS
  Filled 2013-05-14 (×4): qty 2

## 2013-05-14 MED ORDER — HYDRALAZINE HCL 20 MG/ML IJ SOLN
10.0000 mg | INTRAMUSCULAR | Status: DC | PRN
  Administered 2013-05-15 (×3): 10 mg via INTRAVENOUS
  Administered 2013-05-16: 20 mg via INTRAVENOUS
  Filled 2013-05-14 (×3): qty 1

## 2013-05-14 MED ORDER — POTASSIUM CHLORIDE 10 MEQ/50ML IV SOLN
10.0000 meq | INTRAVENOUS | Status: AC
  Administered 2013-05-14 (×4): 10 meq via INTRAVENOUS
  Filled 2013-05-14 (×3): qty 50

## 2013-05-14 MED ORDER — HALOPERIDOL LACTATE 5 MG/ML IJ SOLN
1.0000 mg | INTRAMUSCULAR | Status: DC | PRN
  Administered 2013-05-14 – 2013-05-15 (×2): 2 mg via INTRAVENOUS
  Filled 2013-05-14: qty 2

## 2013-05-14 MED ORDER — VITAL AF 1.2 CAL PO LIQD
1000.0000 mL | ORAL | Status: DC
  Administered 2013-05-15: 1000 mL
  Filled 2013-05-14 (×4): qty 1000

## 2013-05-14 MED ORDER — ACETAMINOPHEN 160 MG/5ML PO SOLN
650.0000 mg | Freq: Four times a day (QID) | ORAL | Status: DC | PRN
  Administered 2013-05-14: 650 mg via ORAL
  Filled 2013-05-14: qty 20.3

## 2013-05-14 MED ORDER — RISPERIDONE 1 MG/ML PO SOLN
1.0000 mg | Freq: Every day | ORAL | Status: DC
  Filled 2013-05-14: qty 1

## 2013-05-14 MED ORDER — SODIUM CHLORIDE 0.9 % IV SOLN
25.0000 ug/h | INTRAVENOUS | Status: DC
  Administered 2013-05-14: 200 ug/h via INTRAVENOUS
  Filled 2013-05-14 (×3): qty 50

## 2013-05-14 MED ORDER — ASPIRIN 81 MG PO CHEW
81.0000 mg | CHEWABLE_TABLET | Freq: Every day | ORAL | Status: DC
  Administered 2013-05-14: 81 mg via ORAL
  Filled 2013-05-14: qty 1

## 2013-05-14 MED ORDER — TICAGRELOR 90 MG PO TABS
90.0000 mg | ORAL_TABLET | Freq: Two times a day (BID) | ORAL | Status: DC
  Administered 2013-05-14: 90 mg
  Filled 2013-05-14 (×3): qty 1

## 2013-05-14 NOTE — Progress Notes (Signed)
05/14/13 1137  Vitals  BP ! 170/125 mmHg  MAP (mmHg) 138  Pulse Rate ! 129  ECG Heart Rate ! 132  Resp 14  Oxygen Therapy  SpO2 96 %    Patient very agitated and restless. Dr. Sung Amabile notified. Will continue to assess and monitor.

## 2013-05-14 NOTE — Progress Notes (Signed)
Patient continues to be agitated and restless. PRN medications administered with no changes. Paged Dr. Sung Amabile to notify him.   05/14/13 1329  Vitals  BP ! 166/104 mmHg  MAP (mmHg) 122  Pulse Rate ! 130  ECG Heart Rate ! 129  Resp ! 22  Oxygen Therapy  SpO2 95 %

## 2013-05-14 NOTE — Progress Notes (Signed)
Subjective:  Awake, follows commands. T max 100.4 F.  Objective:  Vital Signs in the last 24 hours: Temp:  [98.2 F (36.8 C)-100.4 F (38 C)] 100 F (37.8 C) (08/30 0400) Pulse Rate:  [74-128] 80 (08/30 0700) Cardiac Rhythm:  [-] Normal sinus rhythm (08/30 0400) Resp:  [13-19] 18 (08/30 0700) BP: (83-216)/(49-131) 96/51 mmHg (08/30 0700) SpO2:  [93 %-99 %] 96 % (08/30 0700) FiO2 (%):  [40 %] 40 % (08/30 0504) Weight:  [71.4 kg (157 lb 6.5 oz)] 71.4 kg (157 lb 6.5 oz) (08/30 0500)  Physical Exam: BP Readings from Last 1 Encounters:  05/14/13 96/51     Wt Readings from Last 1 Encounters:  05/14/13 71.4 kg (157 lb 6.5 oz)    Weight change: -3.9 kg (-8 lb 9.6 oz)  HEENT: [place]/AT, Eyes-Brown, PERL, EOMI, Conjunctiva-Pale, Sclera-Non-icteric. intubated Neck: No JVD, No bruit, Trachea midline. Lungs:  Clearing, Bilateral. Cardiac:  Regular rhythm, normal S1 and S2, no S3. II/VI systolic murmur.  Abdomen:  Soft, non-tender. Extremities:  No edema present. No cyanosis. No clubbing. CNS: AxOx3, Cranial nerves grossly intact, moves all 4 extremities. Right handed. Skin: Warm and dry.   Intake/Output from previous day: 08/29 0701 - 08/30 0700 In: 3305.5 [I.V.:1095.5; NG/GT:1650; IV Piggyback:560] Out: 6040 [Urine:6040]    Lab Results: BMET    Component Value Date/Time   NA 144 05/14/2013 0450   K 3.0* 05/14/2013 0450   CL 105 05/14/2013 0450   CO2 28 05/14/2013 0450   GLUCOSE 305* 05/14/2013 0450   BUN 35* 05/14/2013 0450   CREATININE 1.44* 05/14/2013 0450   CALCIUM 7.7* 05/14/2013 0450   GFRNONAA 51* 05/14/2013 0450   GFRAA 59* 05/14/2013 0450   CBC    Component Value Date/Time   WBC 11.1* 05/14/2013 0450   RBC 2.84* 05/14/2013 0450   HGB 8.8* 05/14/2013 0450   HCT 25.2* 05/14/2013 0450   PLT 233 05/14/2013 0450   MCV 88.7 05/14/2013 0450   MCH 31.0 05/14/2013 0450   MCHC 34.9 05/14/2013 0450   RDW 14.0 05/14/2013 0450   LYMPHSABS 1.2 05/14/2013 0450   MONOABS 1.1* 05/14/2013 0450    EOSABS 0.0 05/14/2013 0450   BASOSABS 0.0 05/14/2013 0450   CARDIAC ENZYMES Lab Results  Component Value Date   CKTOTAL 1532* 05/11/2013   CKMB 96.6* 05/11/2013   TROPONINI >20.00* 05/11/2013    Scheduled Meds: . antiseptic oral rinse  15 mL Mouth Rinse QID  . aspirin EC  81 mg Oral Daily  . atorvastatin  80 mg Oral q1800  . cefTAZidime (FORTAZ)  IV  1 g Intravenous Q8H  . chlorhexidine  15 mL Mouth Rinse BID  . feeding supplement (VITAL AF 1.2 CAL)  1,000 mL Per Tube Q16H  . furosemide  60 mg Intravenous Q12H  . heparin subcutaneous  5,000 Units Subcutaneous Q8H  . hydrocortisone sodium succinate  25 mg Intravenous Q6H  . insulin aspart  0-15 Units Subcutaneous Q4H  . insulin glargine  5 Units Subcutaneous Daily  . pantoprazole (PROTONIX) IV  40 mg Intravenous Q24H  . potassium chloride  10 mEq Intravenous Q1 Hr x 4  . potassium chloride  20 mEq Oral Daily  . risperiDONE  1 mg Oral BID  . Ticagrelor  90 mg Oral BID  . vancomycin  750 mg Intravenous Q12H   Continuous Infusions: . sodium chloride 20 mL/hr at 05/14/13 0352  . sodium chloride 5 mL/hr at 05/14/13 0357  . fentaNYL infusion INTRAVENOUS Stopped (05/14/13 0733)  .  midazolam (VERSED) infusion Stopped (05/14/13 0733)  . norepinephrine (LEVOPHED) Adult infusion Stopped (05/14/13 0620)   PRN Meds:.acetaminophen (TYLENOL) oral liquid 160 mg/5 mL, albuterol, fentaNYL, fentaNYL, midazolam, ondansetron (ZOFRAN) IV  Assessment/Plan: Acute inferoposterolateral wall myocardial infarction status post PCI 100% occluded large RCA with excellent results status post relook angiogram yesterday noted to have patent RCA with TIMI 3 distal flow  Status post V. fib cardiac arrest  Acute respiratory failure secondary to above rule out aspiration  Probable aspiration pneumonia  Acute anemia rule out GI loss  Mild volume overload  Status post Cardiogenic shock  Hypertension  New-onset diabetes mellitus  Hypercholesteremia  Remote  tobacco abuse  Probable anoxic encephalopathy  Status post renal insufficiency improved  Hypokalemia  Continue medical treatment. Extubation per CCM.   LOS: 5 days    Orpah Cobb  MD  05/14/2013, 7:41 AM

## 2013-05-14 NOTE — Progress Notes (Signed)
Spoke with Dr. Sung Amabile regarding patients continued agitation and elevated heart rate and blood pressure. New order received and will continue to assess and monitor.

## 2013-05-14 NOTE — Progress Notes (Signed)
PULMONARY  / CRITICAL CARE MEDICINE  Name: Rowyn Spilde MRN: [id number] DOB: 1951-07-27    ADMISSION DATE:  05/09/2013  PRIMARY SERVICE: PCCM  CHIEF COMPLAINT:  Cardiac arrest, STEMI  BRIEF PATIENT DESCRIPTION:  62 years old male admitted after cardiac arrest at home. Initial rhythm at EMS arrival was V fib. Down time about 15 minutes. Return to spontaneous circulation after multiple shocks, epinephrine and amiodarone. EKG with inferolateral STEMI. Cath  100% rca stent.  SIGNIFICANT EVENTS / STUDIES:  EKG: Inferolateral STEMI 8/25 cath stent rca 8/25 hypothermia protocol 8/26- ewarm, followed commands, agitation 8/27 near arrest, epi, atropine, noted STEMI Inferior, rising pressors 8/27 cath emergent , stent fxn wnl 8/28 calmer, ventilation improved 8/30 Continued problems with intermittent agitation. Tolerates PSV but periods of hypopnea.  Periods of staring with leftward gaze 8/30 EEG:  8/30 Ct head:   LINES / TUBES: R rad art line 8/24 >> 8/27 ETT 8/24 >>  R IJ CVL 8/25 >>  CULTURES: Sputum 8/26 >> multiple species [facility] 8/27 >> NEG 8/27 urine >> NEG BAL bronch 8/27 >> Klebsiella, enterobacter  ANTIBIOTICS: unasyn 8/26>>>8/27 8/27 vanc>> 8/30 8/27 ceftaz>>>   SUBJECTIVE: Intermittently F/C. Occasional staring spells, occasional agitation  VITAL SIGNS: Temp:  [98.2 F (36.8 C)-100.7 F (38.2 C)] 100.7 F (38.2 C) (08/30 1600) Pulse Rate:  [74-130] 109 (08/30 1600) Resp:  [11-22] 12 (08/30 1600) BP: (83-192)/(51-125) 131/91 mmHg (08/30 1600) SpO2:  [90 %-98 %] 96 % (08/30 1600) FiO2 (%):  [40 %-60 %] 60 % (08/30 1600) Weight:  [71.4 kg (157 lb 6.5 oz)] 71.4 kg (157 lb 6.5 oz) (08/30 0500) HEMODYNAMICS: CVP:  [6 mmHg-14 mmHg] 6 mmHg VENTILATOR SETTINGS: Vent Mode:  [-] PRVC FiO2 (%):  [40 %-60 %] 60 % Set Rate:  [14 bmp-18 bmp] 14 bmp Vt Set:  [400 mL-600 mL] 400 mL PEEP:  [5 cmH20] 5 cmH20 Plateau Pressure:  [21 cmH20-23 cmH20] 21 cmH20 INTAKE /  OUTPUT: Intake/Output     08/29 0701 - 08/30 0700 08/30 0701 - 08/31 0700   I.V. (mL/kg) 1096.2 (15.4) 399.9 (5.6)   NG/GT 1650 435   IV Piggyback 560 200   Total Intake(mL/kg) 3306.2 (46.3) 1034.9 (14.5)   Urine (mL/kg/hr) 6040 (3.5) 2135 (3.2)   Stool     Total Output 6040 2135   Net -2733.9 -1100.1          PHYSICAL EXAMINATION: General: RASS 0, intermittent agitation, + F/C intermittently Neuro: MAEs, EOMI, PERRL HEENT: WNL PULM: clear anteriorly CV: RRR s M GI: soft, BS wnl, nt, nd Ext: symmetric trace ankle edema   LABS:  CBC Recent Labs     05/12/13  0550  05/13/13  0413  05/14/13  0450  WBC  8.2  10.4  11.1*  HGB  8.6*  8.1*  8.8*  HCT  23.8*  23.6*  25.2*  PLT  228  211  233   Coag's No results found for this basename: APTT, INR,  in the last 72 hours BMET Recent Labs     05/12/13  0550  05/13/13  0413  05/14/13  0450  NA  144  144  144  K  3.4*  3.3*  3.0*  CL  112  111  105  CO2  19  24  28   BUN  32*  35*  35*  CREATININE  1.35  1.51*  1.44*  GLUCOSE  269*  271*  305*   Electrolytes Recent Labs     05/12/13  1610  05/13/13  0413  05/14/13  0450  CALCIUM  7.4*  7.4*  7.7*  MG  2.1   --    --    Sepsis Markers No results found for this basename: LACTICACIDVEN, PROCALCITON, O2SATVEN,  in the last 72 hours ABG Recent Labs     05/12/13  0627  PHART  7.496*  PCO2ART  21.5*  PO2ART  97.0   Liver Enzymes Recent Labs     05/12/13  0550  AST  108*  ALT  127*  ALKPHOS  61  BILITOT  0.4  ALBUMIN  2.1*   Cardiac Enzymes No results found for this basename: TROPONINI, PROBNP,  in the last 72 hours Glucose Recent Labs     05/13/13  1640  05/13/13  1928  05/14/13  0001  05/14/13  0428  05/14/13  0745  05/14/13  1120  GLUCAP  190*  226*  197*  283*  196*  178*     CXR:  RLL atx/infiltrate  ASSESSMENT / PLAN:  PULMONARY A: VDRF - difficult to assess on SBT due to intermittent agitation and episodic hypopnea P:   Vent  settings adjusted Cont vent bundle Daily SBT   CARDIOVASCULAR A:  S/P VF arrest Inferolateral STEMI, S/P stent RCA Cardiogenic shock, resolved Hypertension Sinus tachycardia P:  Cont ASA, atorvastatin PRN hydralazine to maintain SBP < 170 mmHg PRN metoprolol to maintain HR < 115/min  RENAL A:   Acute renal failure, nonoliguric, improving Hypokalemia P:   D/C further Lasix Monitor BMET intermittently Correct electrolytes as indicated   GASTROINTESTINAL A:   FOB positive without overt bleeding P:   Cont SUP Cont TFs  HEMATOLOGIC A:  Anemia of critical illness  Monitor CBC intermittently  INFECTIOUS A:   GNR RLL PNA P:   Micro and abx as above  ENDOCRINE A:   Hyperglycemia without prior hx of DM Relative adrenal insuff, resolved P:   Cont SSI D/C HC  NEUROLOGIC A:   Post arrest acute encephalopathy Agitation R/O seizures Intolerance to dex and propofol - hypotension and bradycardia P:   Cont fent infusion Change midaz to PRN only Decrease risperidone to HS only  TODAY'S SUMMARY:   I have personally obtained a history, examined the patient, evaluated laboratory and imaging results, formulated the assessment and plan and placed orders. CRITICAL CARE: The patient is critically ill with multiple organ systems failure and requires high complexity decision making for assessment and support, frequent evaluation and titration of therapies, application of advanced monitoring technologies and extensive interpretation of multiple databases. Critical Care Time devoted to patient care services described in this note is 35 minutes.   Billy Fischer, MD ; [facility] [phone].  After 5:30 PM or weekends, call [phone]

## 2013-05-14 NOTE — Progress Notes (Signed)
eLink Physician-Brief Progress Note Patient Name: Dustin Vang DOB: [birth year] MRN: [id number]  Date of Service  05/14/2013   HPI/Events of Note  Temp of 101.56F   eICU Interventions  Plan: Tylenol 650 mg via tube q6 hours prn temp greater than 100.89F   Intervention Category Minor Interventions: Routine modifications to care plan (e.g. PRN medications for pain, fever)  DETERDING,ELIZABETH 05/14/2013, 11:42 PM

## 2013-05-14 NOTE — Progress Notes (Signed)
eLink Physician-Brief Progress Note Patient Name: Dustin Vang DOB: 1951/09/06 MRN: [id number]  Date of Service  05/14/2013   HPI/Events of Note  Hypokalemia   eICU Interventions  Potassium replaced IV and po   Intervention Category Intermediate Interventions: Electrolyte abnormality - evaluation and management  Zharia Conrow 05/14/2013, 5:58 AM

## 2013-05-15 ENCOUNTER — Inpatient Hospital Stay (HOSPITAL_COMMUNITY): Payer: Medicaid Other

## 2013-05-15 DIAGNOSIS — G9340 Encephalopathy, unspecified: Secondary | ICD-10-CM

## 2013-05-15 LAB — GLUCOSE, CAPILLARY
Glucose-Capillary: 139 mg/dL — ABNORMAL HIGH (ref 70–99)
Glucose-Capillary: 144 mg/dL — ABNORMAL HIGH (ref 70–99)
Glucose-Capillary: 157 mg/dL — ABNORMAL HIGH (ref 70–99)

## 2013-05-15 LAB — CBC
HCT: 27.7 % — ABNORMAL LOW (ref 39.0–52.0)
Hemoglobin: 9.1 g/dL — ABNORMAL LOW (ref 13.0–17.0)
MCH: 30.3 pg (ref 26.0–34.0)
MCV: 92.3 fL (ref 78.0–100.0)
RBC: 3 MIL/uL — ABNORMAL LOW (ref 4.22–5.81)
WBC: 9.9 10*3/uL (ref 4.0–10.5)

## 2013-05-15 LAB — CULTURE, BAL-QUANTITATIVE W GRAM STAIN: Colony Count: >=100000

## 2013-05-15 MED ORDER — TICAGRELOR 90 MG PO TABS
90.0000 mg | ORAL_TABLET | Freq: Two times a day (BID) | ORAL | Status: DC
  Administered 2013-05-15 – 2013-05-19 (×9): 90 mg via ORAL
  Filled 2013-05-15 (×10): qty 1

## 2013-05-15 MED ORDER — FENTANYL CITRATE 0.05 MG/ML IJ SOLN: 12.5000 ug | INTRAMUSCULAR | Status: DC | PRN

## 2013-05-15 MED ORDER — HALOPERIDOL LACTATE 5 MG/ML IJ SOLN
10.0000 mg | Freq: Once | INTRAMUSCULAR | Status: AC
  Administered 2013-05-15: 10 mg via INTRAVENOUS

## 2013-05-15 MED ORDER — AMLODIPINE BESYLATE 5 MG PO TABS
5.0000 mg | ORAL_TABLET | Freq: Every day | ORAL | Status: DC
  Filled 2013-05-15: qty 1

## 2013-05-15 MED ORDER — ASPIRIN 300 MG RE SUPP
150.0000 mg | Freq: Every day | RECTAL | Status: DC
  Administered 2013-05-15: 150 mg via RECTAL
  Filled 2013-05-15 (×2): qty 1

## 2013-05-15 MED ORDER — HALOPERIDOL LACTATE 5 MG/ML IJ SOLN: 10.0000 mg | INTRAMUSCULAR | Status: DC | PRN

## 2013-05-15 MED ORDER — HALOPERIDOL LACTATE 5 MG/ML IJ SOLN: 1.0000 mg | INTRAMUSCULAR | Status: DC | PRN

## 2013-05-15 MED ORDER — ALBUTEROL SULFATE (5 MG/ML) 0.5% IN NEBU: 2.5000 mg | INHALATION_SOLUTION | RESPIRATORY_TRACT | Status: DC | PRN

## 2013-05-15 NOTE — Progress Notes (Signed)
PULMONARY  / CRITICAL CARE MEDICINE  Name: Dustin Vang MRN: [id number] DOB: April 10, 1951    ADMISSION DATE:  05/09/2013  PRIMARY SERVICE: PCCM  CHIEF COMPLAINT:  Cardiac arrest, STEMI  BRIEF PATIENT DESCRIPTION:  62 years old male admitted after cardiac arrest at home. Initial rhythm at EMS arrival was V fib. Down time about 15 minutes. Return to spontaneous circulation after multiple shocks, epinephrine and amiodarone. EKG with inferolateral STEMI. Cath  100% rca stent.  SIGNIFICANT EVENTS / STUDIES:  EKG: Inferolateral STEMI 8/25 cath stent rca 8/25 hypothermia protocol 8/26- ewarm, followed commands, agitation 8/27 near arrest, epi, atropine, noted STEMI Inferior, rising pressors 8/27 cath emergent , stent fxn wnl 8/28 calmer, ventilation improved 8/30 Continued problems with intermittent agitation. Tolerates PSV but periods of hypopnea.  Periods of staring with leftward gaze 8/30 EEG (ordered):   LINES / TUBES: R rad art line 8/24 >> 8/27 ETT 8/24 >> 8/31 R IJ CVL 8/25 >>  CULTURES: Sputum 8/26 >> multiple species [facility] 8/27 >> NEG 8/27 urine >> NEG BAL bronch 8/27 >> Klebsiella, enterobacter  ANTIBIOTICS: unasyn 8/26>>>8/27 8/27 vanc>> 8/30 8/27 ceftaz>>    SUBJECTIVE: Much calmer. +FC consistently. Passed SBT. Fever noted  VITAL SIGNS: Temp:  [98.7 F (37.1 C)-102 F (38.9 C)] 99.9 F (37.7 C) (08/31 1200) Pulse Rate:  [87-142] 87 (08/31 1300) Resp:  [7-25] 8 (08/31 1300) BP: (99-210)/(65-120) 174/110 mmHg (08/31 1300) SpO2:  [92 %-100 %] 98 % (08/31 1300) FiO2 (%):  [40 %-60 %] 40 % (08/31 0853) Weight:  [71.2 kg (156 lb 15.5 oz)] 71.2 kg (156 lb 15.5 oz) (08/31 0400) HEMODYNAMICS:   VENTILATOR SETTINGS: Vent Mode:  [-] PRVC FiO2 (%):  [40 %-60 %] 40 % Set Rate:  [14 bmp] 14 bmp Vt Set:  [400 mL] 400 mL PEEP:  [5 cmH20] 5 cmH20 Pressure Support:  [5 cmH20] 5 cmH20 Plateau Pressure:  [13 cmH20-31 cmH20] 14 cmH20 INTAKE / OUTPUT: Intake/Output     08/30  0701 - 08/31 0700 08/31 0701 - 09/01 0700   I.V. (mL/kg) 1074.9 (15.1) 241.7 (3.4)   Other 100    NG/GT 1255 20   IV Piggyback 300 50   Total Intake(mL/kg) 2729.9 (38.3) 311.7 (4.4)   Urine (mL/kg/hr) 3365 (2)    Total Output 3365     Net -635.1 +311.7          PHYSICAL EXAMINATION: General: RASS 0, + F/C Neuro: MAEs, EOMI, PERRL HEENT: WNL PULM: clear anteriorly CV: RRR s M GI: soft, BS wnl, nt, nd Ext: symmetric trace ankle edema   LABS:  CBC Recent Labs     05/13/13  0413  05/14/13  0450  05/15/13  0422  WBC  10.4  11.1*  9.9  HGB  8.1*  8.8*  9.1*  HCT  23.6*  25.2*  27.7*  PLT  211  233  250   Coag's No results found for this basename: APTT, INR,  in the last 72 hours BMET Recent Labs     05/13/13  0413  05/14/13  0450  NA  144  144  K  3.3*  3.0*  CL  111  105  CO2  24  28  BUN  35*  35*  CREATININE  1.51*  1.44*  GLUCOSE  271*  305*   Electrolytes Recent Labs     05/13/13  0413  05/14/13  0450  CALCIUM  7.4*  7.7*   Sepsis Markers No results found for this basename:  LACTICACIDVEN, PROCALCITON, O2SATVEN,  in the last 72 hours ABG No results found for this basename: PHART, PCO2ART, PO2ART,  in the last 72 hours Liver Enzymes No results found for this basename: AST, ALT, ALKPHOS, BILITOT, ALBUMIN,  in the last 72 hours Cardiac Enzymes No results found for this basename: TROPONINI, PROBNP,  in the last 72 hours Glucose Recent Labs     05/14/13  0745  05/14/13  1120  05/14/13  1605  05/14/13  1940  05/14/13  2324  05/15/13  0411  GLUCAP  196*  178*  182*  143*  164*  144*     CXR:  NNF  ASSESSMENT / PLAN:  PULMONARY A: VDRF, resolved P:   Monitor in ICU post extubation  CARDIOVASCULAR A:  S/P VF arrest Inferolateral STEMI, S/P stent RCA Cardiogenic shock, resolved Hypertension Sinus tachycardia P:  Cont ASA, atorvastatin Cont PRN hydralazine to maintain SBP < 170 mmHg Cont PRN metoprolol to maintain HR <  115/min  RENAL A:   Acute renal failure, nonoliguric, improving Hypokalemia P:   Monitor BMET intermittently Correct electrolytes as indicated   GASTROINTESTINAL A:   FOB positive without overt bleeding P:   Cont SUP NPO post extubation Consider diet and/or SLP eval 9/1  HEMATOLOGIC A:  Anemia of critical illness  Monitor CBC intermittently  INFECTIOUS A:   GNR RLL PNA Persistent fever without other overt source P:   Micro and abx as above  ENDOCRINE A:   Mild hyperglycemia without prior hx of DM Relative adrenal insuff, resolved P:   Change CBGs to q 8 hrs and D/C SSI  NEUROLOGIC A:   Post arrest acute encephalopathy Agitation, much improved Intolerance to dex and propofol - hypotension/ bradycardia P:   D/C fent gtt D/C midaz D/C risperidone Cont PRN haldol  TODAY'S SUMMARY:   I have personally obtained a history, examined the patient, evaluated laboratory and imaging results, formulated the assessment and plan and placed orders. CRITICAL CARE: The patient is critically ill with multiple organ systems failure and requires high complexity decision making for assessment and support, frequent evaluation and titration of therapies, application of advanced monitoring technologies and extensive interpretation of multiple databases. Critical Care Time devoted to patient care services described in this note is 35 minutes.   Billy Fischer, MD ; [facility] [phone].  After 5:30 PM or weekends, call [phone]

## 2013-05-15 NOTE — Progress Notes (Signed)
Subjective:  T max 102 F. Still intubated. Not tolerating lack of sedation. Elevated blood pressure.  Objective:  Vital Signs in the last 24 hours: Temp:  [98.7 F (37.1 C)-102 F (38.9 C)] 99.6 F (37.6 C) (08/31 0800) Pulse Rate:  [75-142] 106 (08/31 0800) Cardiac Rhythm:  [-] Sinus tachycardia (08/31 0800) Resp:  [11-22] 13 (08/31 0800) BP: (97-180)/(57-125) 180/104 mmHg (08/31 0800) SpO2:  [90 %-98 %] 97 % (08/31 0800) FiO2 (%):  [40 %-60 %] 40 % (08/31 0800) Weight:  [71.2 kg (156 lb 15.5 oz)] 71.2 kg (156 lb 15.5 oz) (08/31 0400)  Physical Exam: BP Readings from Last 1 Encounters:  05/15/13 180/104     Wt Readings from Last 1 Encounters:  05/15/13 71.2 kg (156 lb 15.5 oz)    Weight change: -0.2 kg (-7.1 oz)  HEENT: [place]/AT, Eyes-Brown, PERL, EOMI, Conjunctiva-Pale, Sclera-Non-icteric, intubated. Neck: No JVD, No bruit, Trachea midline. Lungs:  Clear, Bilateral. Cardiac:  Regular rhythm, normal S1 and S2, no S3. II/VI systolic murmur. Abdomen:  Soft, non-tender. Extremities:  No edema present. No cyanosis. No clubbing. CNS: AxOx3, Cranial nerves grossly intact, moves all 4 extremities. Right handed. Skin: Warm and dry.   Intake/Output from previous day: 08/30 0701 - 08/31 0700 In: 2729.9 [I.V.:1074.9; NG/GT:1255; IV Piggyback:300] Out: 3365 [Urine:3365]    Lab Results: BMET    Component Value Date/Time   NA 144 05/14/2013 0450   K 3.0* 05/14/2013 0450   CL 105 05/14/2013 0450   CO2 28 05/14/2013 0450   GLUCOSE 305* 05/14/2013 0450   BUN 35* 05/14/2013 0450   CREATININE 1.44* 05/14/2013 0450   CALCIUM 7.7* 05/14/2013 0450   GFRNONAA 51* 05/14/2013 0450   GFRAA 59* 05/14/2013 0450   CBC    Component Value Date/Time   WBC 9.9 05/15/2013 0422   RBC 3.00* 05/15/2013 0422   HGB 9.1* 05/15/2013 0422   HCT 27.7* 05/15/2013 0422   PLT 250 05/15/2013 0422   MCV 92.3 05/15/2013 0422   MCH 30.3 05/15/2013 0422   MCHC 32.9 05/15/2013 0422   RDW 14.3 05/15/2013 0422   LYMPHSABS  1.2 05/14/2013 0450   MONOABS 1.1* 05/14/2013 0450   EOSABS 0.0 05/14/2013 0450   BASOSABS 0.0 05/14/2013 0450   CARDIAC ENZYMES Lab Results  Component Value Date   CKTOTAL 1532* 05/11/2013   CKMB 96.6* 05/11/2013   TROPONINI >20.00* 05/11/2013    Scheduled Meds: . amLODipine  5 mg Oral Daily  . antiseptic oral rinse  15 mL Mouth Rinse QID  . aspirin  81 mg Oral Daily  . atorvastatin  80 mg Oral q1800  . cefTAZidime (FORTAZ)  IV  1 g Intravenous Q8H  . chlorhexidine  15 mL Mouth Rinse BID  . feeding supplement (VITAL AF 1.2 CAL)  1,000 mL Per Tube Q16H  . heparin subcutaneous  5,000 Units Subcutaneous Q8H  . insulin aspart  0-15 Units Subcutaneous Q4H  . insulin glargine  5 Units Subcutaneous Daily  . pantoprazole sodium  40 mg Per Tube Q1200  . risperiDONE  1 mg Per Tube QHS  . Ticagrelor  90 mg Per Tube BID   Continuous Infusions: . sodium chloride 20 mL/hr at 05/14/13 0352  . sodium chloride 20 mL/hr at 05/15/13 0724  . fentaNYL infusion INTRAVENOUS Stopped (05/15/13 0723)   PRN Meds:.acetaminophen (TYLENOL) oral liquid 160 mg/5 mL, albuterol, fentaNYL, hydrALAZINE, metoprolol, midazolam, ondansetron (ZOFRAN) IV  Assessment/Plan: Acute inferoposterolateral wall myocardial infarction status post PCI 100% occluded large RCA with excellent  results status post relook angiogram yesterday noted to have patent RCA with TIMI 3 distal flow  Status post V. fib cardiac arrest  Acute respiratory failure secondary to above rule out aspiration  Probable aspiration pneumonia  Acute anemia rule out GI loss  Mild volume overload  Status post Cardiogenic shock  Hypertension  New-onset diabetes mellitus  Hypercholesteremia  Remote tobacco abuse  Probable anoxic encephalopathy  Status post renal insufficiency improved  Hypokalemia  Add Norvasc for blood pressure control.    LOS: 6 days    Orpah Cobb  MD  05/15/2013, 8:46 AM

## 2013-05-15 NOTE — Progress Notes (Signed)
Patient very agitated and pulled out OG tube. Notified Dr. Herma Carson and will hold replacing until we see if patient is extubated.

## 2013-05-15 NOTE — Procedures (Signed)
Extubation Procedure Note  Patient Details:   Name: Dustin Vang DOB: February 14, 1951 MRN: [id number]   Airway Documentation:   Patient weaned from vent support successfully and extubated. Patient is wearing nasal cannula at 4lpm O2.   Evaluation  O2 sats: stable throughout Complications: No apparent complications Patient did tolerate procedure well. Bilateral Breath Sounds: Clear;Diminished Suctioning: Airway Yes  Clearance Coots 05/15/2013, 9:59 AM

## 2013-05-15 NOTE — Progress Notes (Signed)
eLink Physician-Brief Progress Note Patient Name: Dustin Vang DOB: 1951-02-06 MRN: [id number]  Date of Service  05/15/2013   HPI/Events of Note  Continued agitation despite smaller doses of haldol IV.  Most recent was 2 mg approx 3am.   eICU Interventions  Plan: 10 mg haldol IV times one Continue to monitor   Intervention Category Major Interventions: Delirium, psychosis, severe agitation - evaluation and management  DETERDING,ELIZABETH 05/15/2013, 5:52 AM

## 2013-05-16 ENCOUNTER — Encounter (HOSPITAL_COMMUNITY): Payer: Self-pay | Admitting: General Practice

## 2013-05-16 ENCOUNTER — Inpatient Hospital Stay (HOSPITAL_COMMUNITY): Payer: Medicaid Other

## 2013-05-16 LAB — CBC
MCV: 90 fL (ref 78.0–100.0)
Platelets: 286 10*3/uL (ref 150–400)
RDW: 14.1 % (ref 11.5–15.5)
WBC: 12 10*3/uL — ABNORMAL HIGH (ref 4.0–10.5)

## 2013-05-16 LAB — BASIC METABOLIC PANEL
CO2: 23 mEq/L (ref 19–32)
Calcium: 8.6 mg/dL (ref 8.4–10.5)
Creatinine, Ser: 0.79 mg/dL (ref 0.50–1.35)
GFR calc Af Amer: >90 mL/min (ref >90)

## 2013-05-16 LAB — GLUCOSE, CAPILLARY
Glucose-Capillary: 167 mg/dL — ABNORMAL HIGH (ref 70–99)
Glucose-Capillary: 150 mg/dL — ABNORMAL HIGH (ref 70–99)

## 2013-05-16 LAB — HEPATIC FUNCTION PANEL
AST: 44 U/L — ABNORMAL HIGH (ref 0–37)
Bilirubin, Direct: 0.2 mg/dL (ref 0.0–0.3)

## 2013-05-16 MED ORDER — METOPROLOL TARTRATE 25 MG PO TABS
25.0000 mg | ORAL_TABLET | Freq: Two times a day (BID) | ORAL | Status: DC
  Administered 2013-05-16 (×2): 25 mg via ORAL
  Filled 2013-05-16 (×4): qty 1

## 2013-05-16 MED ORDER — ENOXAPARIN SODIUM 40 MG/0.4ML ~~LOC~~ SOLN
40.0000 mg | Freq: Every day | SUBCUTANEOUS | Status: DC
  Administered 2013-05-16 – 2013-05-19 (×4): 40 mg via SUBCUTANEOUS
  Filled 2013-05-16 (×4): qty 0.4

## 2013-05-16 MED ORDER — CIPROFLOXACIN HCL 500 MG PO TABS
500.0000 mg | ORAL_TABLET | Freq: Two times a day (BID) | ORAL | Status: DC
  Administered 2013-05-16 – 2013-05-17 (×3): 500 mg via ORAL
  Filled 2013-05-16 (×5): qty 1

## 2013-05-16 MED ORDER — RAMIPRIL 2.5 MG PO CAPS
2.5000 mg | ORAL_CAPSULE | Freq: Every day | ORAL | Status: DC
  Administered 2013-05-16 – 2013-05-17 (×2): 2.5 mg via ORAL
  Filled 2013-05-16 (×3): qty 1

## 2013-05-16 MED ORDER — ASPIRIN EC 81 MG PO TBEC
81.0000 mg | DELAYED_RELEASE_TABLET | Freq: Every day | ORAL | Status: DC
  Administered 2013-05-16 – 2013-05-19 (×4): 81 mg via ORAL
  Filled 2013-05-16 (×4): qty 1

## 2013-05-16 NOTE — Progress Notes (Signed)
225cc fentanyl wasted in sink; witnessed by 2nd RN; Shearon Balo, RN

## 2013-05-16 NOTE — Progress Notes (Signed)
Patient received to 2W09 from 2H. Patient in bedside chair.  Unable to speak english; family at bedside to translate care. Patient denies pain or distress. Call bell near.Dustin Vang

## 2013-05-16 NOTE — Progress Notes (Signed)
Pt transferred from bed to chair with 1 person assist; son translating states that he reports he feels well; CVL out, PIV inserted; diet ordered per MD; emotional support given to pt & to family at bedside

## 2013-05-16 NOTE — Progress Notes (Signed)
Assisted patient to bed per request. Call bell and family near.Dustin Vang

## 2013-05-16 NOTE — Progress Notes (Signed)
PULMONARY  / CRITICAL CARE MEDICINE  Name: Dustin Vang MRN: [id number] DOB: 1951-04-30    ADMISSION DATE:  05/09/2013  PRIMARY SERVICE: PCCM  CHIEF COMPLAINT:  Cardiac arrest, STEMI  BRIEF PATIENT DESCRIPTION:  62 years old male admitted after cardiac arrest at home. Initial rhythm at EMS arrival was V fib. Down time about 15 minutes. Return to spontaneous circulation after multiple shocks, epinephrine and amiodarone. EKG with inferolateral STEMI. Cath  100% rca stent.  SIGNIFICANT EVENTS / STUDIES:  EKG: Inferolateral STEMI 8/25 cath stent rca 8/25 hypothermia protocol 8/26- ewarm, followed commands, agitation 8/27 near arrest, epi, atropine, noted STEMI Inferior, rising pressors 8/27 cath emergent , stent fxn wnl 8/28 calmer, ventilation improved 8/30 Continued problems with intermittent agitation. Tolerates PSV but periods of hypopnea.  Periods of staring with leftward gaze 8/30 EEG (ordered): Cancelled 9/01 due to dramatic neurological improvement 9/01 transferred to telemetry. PCCM signed off. Dr Sharyn Lull assumed primary service duties  LINES / TUBES: R rad art line 8/24 >> 8/27 ETT 8/24 >> 8/31 R IJ CVL 8/25 >>  CULTURES: Sputum 8/26 >> multiple species [facility] 8/27 >> NEG 8/27 urine >> NEG BAL bronch 8/27 >> Klebsiella, enterobacter  ANTIBIOTICS: unasyn 8/26>>>8/27 8/27 vanc>> 8/30 8/27 ceftaz>> 9/01 9/01 Cipro >> 9/05 (stop date ordered)   SUBJECTIVE: No distress. No complaints. Cognition appears intact  VITAL SIGNS: Temp:  [97.9 F (36.6 C)-99.7 F (37.6 C)] 97.9 F (36.6 C) (09/01 1200) Pulse Rate:  [82-133] 99 (09/01 1200) Resp:  [10-30] 16 (09/01 1200) BP: (110-195)/(77-115) 141/97 mmHg (09/01 1200) SpO2:  [93 %-99 %] 98 % (09/01 1200) HEMODYNAMICS:   VENTILATOR SETTINGS:   INTAKE / OUTPUT: Intake/Output     08/31 0701 - 09/01 0700 09/01 0701 - 09/02 0700   P.O. 150    I.V. (mL/kg) 961.7 (13.5) 120 (1.7)   Other     NG/GT 20    IV Piggyback 150     Total Intake(mL/kg) 1281.7 (18) 120 (1.7)   Urine (mL/kg/hr) 3800 (2.2) 700 (1.6)   Total Output 3800 700   Net -2518.3 -580          PHYSICAL EXAMINATION: General: RASS 0, + F/C Neuro: MAEs, EOMI, PERRL HEENT: WNL PULM: clear anteriorly CV: RRR s M GI: soft, BS wnl, nt, nd Ext: symmetric trace pedal and carpal edema   LABS:  CBC Recent Labs     05/14/13  0450  05/15/13  0422  05/16/13  0359  WBC  11.1*  9.9  12.0*  HGB  8.8*  9.1*  10.7*  HCT  25.2*  27.7*  31.5*  PLT  233  250  286   Coag's No results found for this basename: APTT, INR,  in the last 72 hours BMET Recent Labs     05/14/13  0450  05/16/13  0359  NA  144  138  K  3.0*  3.6  CL  105  101  CO2  28  23  BUN  35*  23  CREATININE  1.44*  0.79  GLUCOSE  305*  172*   Electrolytes Recent Labs     05/14/13  0450  05/16/13  0359  CALCIUM  7.7*  8.6   Sepsis Markers No results found for this basename: LACTICACIDVEN, PROCALCITON, O2SATVEN,  in the last 72 hours ABG No results found for this basename: PHART, PCO2ART, PO2ART,  in the last 72 hours Liver Enzymes Recent Labs     05/16/13  0359  AST  44*  ALT  60*  ALKPHOS  66  BILITOT  1.0  ALBUMIN  2.7*   Cardiac Enzymes No results found for this basename: TROPONINI, PROBNP,  in the last 72 hours Glucose Recent Labs     05/14/13  2324  05/15/13  0411  05/15/13  0726  05/15/13  1601  05/15/13  2336  05/16/13  0722  GLUCAP  164*  144*  139*  144*  157*  167*     CXR:  NACPD  ASSESSMENT / PLAN:  PULMONARY A: VDRF, resolved P:   Transfer to tele  CARDIOVASCULAR A:  S/P VF arrest Inferolateral STEMI, S/P stent RCA Cardiogenic shock, resolved Hypertension Sinus tachycardia P:  Further mgmt per Cards  RENAL A:   Acute renal failure, nonoliguric, improving Hypokalemia P:   Monitor BMET intermittently Correct electrolytes as indicated  GASTROINTESTINAL A:   FOB positive without overt bleeding P:   Begin diet  9/01  HEMATOLOGIC A:  Anemia of critical illness P: Monitor CBC intermittently  INFECTIOUS A:   GNR RLL PNA Persistent fever without other overt source P:   Micro and abx as above  ENDOCRINE A:   Mild hyperglycemia without prior hx of DM Relative adrenal insuff, resolved P:   Cont CBGs to q 8 hrs COnt to monitor off SSI - resume for CBGs > 180 or 200 Hgb A1C ordered for 9/02  NEUROLOGIC A:   Post arrest acute encephalopathy, resolved Agitation, resolved P:   COnt current rx and monitoring   Have discussed with Dr Sharyn Lull who has agreed to assume primary service duties. PCCM will sign off. Please call if we can be of further assistance  Billy Fischer, MD ; [facility] [phone].  After 5:30 PM or weekends, call [phone]

## 2013-05-16 NOTE — Progress Notes (Signed)
Subjective:  Patient is doing well denies any chest pain or shortness of breath extubated yesterday.  Objective:  Vital Signs in the last 24 hours: Temp:  [98.6 F (37 C)-99.9 F (37.7 C)] 98.6 F (37 C) (09/01 0934) Pulse Rate:  [82-133] 107 (09/01 0934) Resp:  [8-30] 16 (09/01 0934) BP: (110-195)/(77-115) 127/82 mmHg (09/01 0900) SpO2:  [96 %-100 %] 97 % (09/01 0934)  Intake/Output from previous day: 08/31 0701 - 09/01 0700 In: 1281.7 [P.O.:150; I.V.:961.7; NG/GT:20; IV Piggyback:150] Out: 3800 [Urine:3800] Intake/Output from this shift: Total I/O In: 80 [I.V.:80] Out: 300 [Urine:300]  Physical Exam: Neck: no adenopathy, no carotid bruit, no JVD and supple, symmetrical, trachea midline Lungs: clear to auscultation bilaterally Heart: regular rate and rhythm, S1, S2 normal and Soft systolic murmur noted Abdomen: soft, non-tender; bowel sounds normal; no masses,  no organomegaly Extremities: extremities normal, atraumatic, no cyanosis or edema  Lab Results:  Recent Labs  05/15/13 0422 05/16/13 0359  WBC 9.9 12.0*  HGB 9.1* 10.7*  PLT 250 286    Recent Labs  05/14/13 0450 05/16/13 0359  NA 144 138  K 3.0* 3.6  CL 105 101  CO2 28 23  GLUCOSE 305* 172*  BUN 35* 23  CREATININE 1.44* 0.79   No results found for this basename: TROPONINI, CK, MB,  in the last 72 hours Hepatic Function Panel  Recent Labs  05/16/13 0359  PROT 7.0  ALBUMIN 2.7*  AST 44*  ALT 60*  ALKPHOS 66  BILITOT 1.0  BILIDIR 0.2  IBILI 0.8   No results found for this basename: CHOL,  in the last 72 hours No results found for this basename: PROTIME,  in the last 72 hours  Imaging: Imaging results have been reviewed and Dg Chest Port 1 View  05/16/2013   *RADIOLOGY REPORT*  Clinical Data: Respiratory failure  PORTABLE CHEST - 1 VIEW  Comparison: 05/14/2013  Findings: The endotracheal tube and nasogastric catheter been removed in the interval.  A left jugular line remains in satisfactory  position.  Cardiac shadow remains mildly enlarged. The lungs are clear bilaterally.  IMPRESSION: No acute abnormalities seen.   Original Report Authenticated By: Alcide Clever, M.D.    Cardiac Studies:  Assessment/Plan:  Acute inferoposterolateral wall myocardial infarction status post PCI 100% occluded large RCA with excellent results status post relook angiogram yesterday noted to have patent RCA with TIMI 3 distal flow  Status post V. fib cardiac arrest  Status post vent dependent respiratory failure Acute anemia rule out GI loss  Status post Cardiogenic shock  Hypertension  New-onset diabetes mellitus  Hypercholesteremia  Remote tobacco abuse  Status post renal insufficiency improved  Plan Add low-dose beta blockers and ACE inhibitors as per orders Phase I cardiac rehabilitation  out of bed to chair  LOS: 7 days    Malique Driskill N 05/16/2013, 10:22 AM

## 2013-05-17 LAB — CBC
HCT: 30.6 % — ABNORMAL LOW (ref 39.0–52.0)
MCV: 87.9 fL (ref 78.0–100.0)
RDW: 13.7 % (ref 11.5–15.5)
WBC: 8.4 10*3/uL (ref 4.0–10.5)

## 2013-05-17 LAB — BASIC METABOLIC PANEL
Calcium: 8.7 mg/dL (ref 8.4–10.5)
Chloride: 103 mEq/L (ref 96–112)
Creatinine, Ser: 0.92 mg/dL (ref 0.50–1.35)
GFR calc Af Amer: >90 mL/min (ref >90)
GFR calc non Af Amer: 89 mL/min — ABNORMAL LOW (ref >90)

## 2013-05-17 LAB — CULTURE, BLOOD (ROUTINE X 2)
Culture: NO GROWTH
Culture: NO GROWTH

## 2013-05-17 LAB — GLUCOSE, CAPILLARY
Glucose-Capillary: 134 mg/dL — ABNORMAL HIGH (ref 70–99)
Glucose-Capillary: 158 mg/dL — ABNORMAL HIGH (ref 70–99)

## 2013-05-17 MED ORDER — METOPROLOL TARTRATE 50 MG PO TABS
50.0000 mg | ORAL_TABLET | Freq: Two times a day (BID) | ORAL | Status: DC
  Administered 2013-05-17 – 2013-05-19 (×4): 50 mg via ORAL
  Filled 2013-05-17 (×5): qty 1

## 2013-05-17 MED ORDER — METFORMIN HCL ER 500 MG PO TB24
500.0000 mg | ORAL_TABLET | Freq: Every day | ORAL | Status: DC
  Administered 2013-05-18 – 2013-05-19 (×2): 500 mg via ORAL
  Filled 2013-05-17 (×4): qty 1

## 2013-05-17 MED ORDER — LEVOFLOXACIN 750 MG PO TABS
750.0000 mg | ORAL_TABLET | Freq: Every day | ORAL | Status: DC
  Administered 2013-05-17 – 2013-05-19 (×3): 750 mg via ORAL
  Filled 2013-05-17 (×3): qty 1

## 2013-05-17 MED ORDER — POTASSIUM CHLORIDE CRYS ER 20 MEQ PO TBCR
40.0000 meq | EXTENDED_RELEASE_TABLET | Freq: Once | ORAL | Status: AC
  Administered 2013-05-17: 40 meq via ORAL
  Filled 2013-05-17: qty 2

## 2013-05-17 NOTE — Progress Notes (Signed)
Subjective:  Patient complains of cough with pleuritic chest pain and yellowish mucus no fever or chills. Patient had few beats of nonsustained SVT on monitor asymptomatic Objective:  Vital Signs in the last 24 hours: Temp:  [97.9 F (36.6 C)-99.7 F (37.6 C)] 99.7 F (37.6 C) (09/02 0355) Pulse Rate:  [89-107] 96 (09/02 0355) Resp:  [16-18] 18 (09/02 0355) BP: (135-150)/(86-97) 146/87 mmHg (09/02 0355) SpO2:  [94 %-98 %] 94 % (09/02 0355)  Intake/Output from previous day: 09/01 0701 - 09/02 0700 In: 360 [P.O.:240; I.V.:120] Out: 700 [Urine:700] Intake/Output from this shift: Total I/O In: 240 [P.O.:240] Out: -   Physical Exam: Neck: no adenopathy, no carotid bruit, no JVD and supple, symmetrical, trachea midline Lungs: clear to auscultation bilaterally Heart: regular rate and rhythm, S1, S2 normal and Soft systolic murmur noted no S3 gallop Abdomen: soft, non-tender; bowel sounds normal; no masses,  no organomegaly Extremities: extremities normal, atraumatic, no cyanosis or edema  Lab Results:  Recent Labs  05/16/13 0359 05/17/13 0544  WBC 12.0* 8.4  HGB 10.7* 10.6*  PLT 286 303    Recent Labs  05/16/13 0359 05/17/13 0544  NA 138 138  K 3.6 3.3*  CL 101 103  CO2 23 22  GLUCOSE 172* 152*  BUN 23 23  CREATININE 0.79 0.92   No results found for this basename: TROPONINI, CK, MB,  in the last 72 hours Hepatic Function Panel  Recent Labs  05/16/13 0359  PROT 7.0  ALBUMIN 2.7*  AST 44*  ALT 60*  ALKPHOS 66  BILITOT 1.0  BILIDIR 0.2  IBILI 0.8   No results found for this basename: CHOL,  in the last 72 hours No results found for this basename: PROTIME,  in the last 72 hours  Imaging: Imaging results have been reviewed and Dg Chest Port 1 View  05/16/2013   *RADIOLOGY REPORT*  Clinical Data: Respiratory failure  PORTABLE CHEST - 1 VIEW  Comparison: 05/14/2013  Findings: The endotracheal tube and nasogastric catheter been removed in the interval.  A left  jugular line remains in satisfactory position.  Cardiac shadow remains mildly enlarged. The lungs are clear bilaterally.  IMPRESSION: No acute abnormalities seen.   Original Report Authenticated By: Alcide Clever, M.D.    Cardiac Studies:  Assessment/Plan:  Status post Acute inferoposterolateral wall myocardial infarction status post PCI 100% occluded large RCA with excellent results status post relook angiogram yesterday noted to have patent RCA with TIMI 3 distal flow  Status post V. fib cardiac arrest  Status post vent dependent respiratory failure  Acute anemia rule out GI loss  Status post Cardiogenic shock  Hypertension  New-onset diabetes mellitus  Hypercholesteremia  Remote tobacco abuse  Status post renal insufficiency improved  Bronchitis Hypokalemia Plan Change Cipro to Levaquin as per orders Replace K. Start metformin Increase beta blockers as per orders OT PT consult Increase ambulation Check labs in a.m.  LOS: 8 days    Leba Tibbitts N 05/17/2013, 11:06 AM

## 2013-05-17 NOTE — Progress Notes (Signed)
NUTRITION FOLLOW UP  Intervention:   1. No nutrition interventions at this time.  2. RD will continue to follow  Nutrition Dx:   Inadequate oral intake related to inability to eat as evidenced by NPO, intubated. Improving   Goal:   Enteral nutrition goal no longer applicable. New Goal:  Meet >/=90% estimated nutrition needs with oral intake.   Monitor:   PO intake, weight trends, labs, I/O's  Assessment:   Pt was extubated on 8/31. Diet has been advanced. Per son in room, pt has been eatng well. Appetite is normal. Denied any additional needs at this time.   Height: Ht Readings from Last 1 Encounters:  05/09/13 5\' 1"  (1.549 m)    Weight Status:   Wt Readings from Last 1 Encounters:  05/15/13 156 lb 15.5 oz (71.2 kg)    Re-estimated needs:  Kcal: 1700-1900 Protein: 70-80 g  Fluid: 1.7-1.9 L   Skin: abrasion on ear, burns on chest from Zoll pads  Diet Order: Carb Control   Intake/Output Summary (Last 24 hours) at 05/17/13 1154 Last data filed at 05/17/13 0800  Gross per 24 hour  Intake    480 ml  Output      0 ml  Net    480 ml    Last BM: 9/1   Labs:   Recent Labs Lab 05/11/13 0850 05/12/13 0550  05/14/13 0450 05/16/13 0359 05/17/13 0544  NA 153* 144  < > 144 138 138  K 4.5 3.4*  < > 3.0* 3.6 3.3*  CL 111 112  < > 105 101 103  CO2 25 19  < > 28 23 22   BUN 23 32*  < > 35* 23 23  CREATININE 1.50* 1.35  < > 1.44* 0.79 0.92  CALCIUM 6.9* 7.4*  < > 7.7* 8.6 8.7  MG 2.2 2.1  --   --   --   --   PHOS 4.7*  --   --   --   --   --   GLUCOSE 178* 269*  < > 305* 172* 152*  < > = values in this interval not displayed.  CBG (last 3)   Recent Labs  05/16/13 1635 05/16/13 2353 05/17/13 0815  GLUCAP 150* 158* 238*    Scheduled Meds: . aspirin EC  81 mg Oral Daily  . atorvastatin  80 mg Oral q1800  . enoxaparin (LOVENOX) injection  40 mg Subcutaneous Daily  . levofloxacin  750 mg Oral Daily  . metFORMIN  500 mg Oral Q breakfast  . metoprolol  tartrate  50 mg Oral BID  . ramipril  2.5 mg Oral Daily  . Ticagrelor  90 mg Oral BID    Continuous Infusions: . sodium chloride Stopped (05/16/13 1000)  . sodium chloride Stopped (05/16/13 1000)    Clarene Duke RD, LDN Pager [phone] After Hours pager [phone]

## 2013-05-17 NOTE — Progress Notes (Signed)
CSW received referral for assistance with medication affordability. CSW referred this to the Case Manager who will follow up with patient and family. CSW signing off. Please re consult if CSW needs arise.  Maree Krabbe, MSW, [facility] [phone]

## 2013-05-17 NOTE — Evaluation (Signed)
Physical Therapy Evaluation Patient Details Name: Dustin Vang MRN: [id number] DOB: [birth year] Time: 0865-7846 PT Time Calculation (min): 15 min  PT Assessment / Plan / Recommendation History of Present Illness  62 years old male admitted after cardiac arrest at home. Initial rhythm at EMS arrival was V fib. Down time about 15 minutes. Return to spontaneous circulation after multiple shocks, epinephrine and amiodarone. EKG with inferolateral STEMI. Cath  100% rca stent.  Clinical Impression  Pt at baseline functional status without further therapy needs. Son present and assisting with translation. 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 Pt able to perform all mobility without assist. Pt and son both state agreeable to no further needs and encouraged to continue ambulation.     PT Assessment  Patent does not need any further PT services    Follow Up Recommendations  No PT follow up    Does the patient have the potential to tolerate intense rehabilitation      Barriers to Discharge        Equipment Recommendations  None recommended by PT    Recommendations for Other Services     Frequency      Precautions / Restrictions Precautions Precautions: None   Pertinent Vitals/Pain No pain HR 97-99 throughout      Mobility  Bed Mobility Bed Mobility: Supine to Sit;Sit to Supine Supine to Sit: 6: Modified independent (Device/Increase time);HOB flat Sit to Supine: 6: Modified independent (Device/Increase time);HOB flat Transfers Transfers: Sit to Stand;Stand to Sit;Stand Pivot Transfers Sit to Stand: 6: Modified independent (Device/Increase time);From chair/3-in-1;From bed Stand to Sit: 6: Modified independent (Device/Increase time);To bed;To chair/3-in-1 Stand Pivot Transfers: 6: Modified independent (Device/Increase time) Ambulation/Gait Ambulation/Gait  Assistance: 7: Independent Ambulation Distance (Feet): 450 Feet Assistive device: None Gait Pattern: Within Functional Limits Gait velocity: WFL Stairs: Yes Stairs Assistance: 6: Modified independent (Device/Increase time) Stair Management Technique: One rail Right;Alternating pattern;Forwards Number of Stairs: 5    Exercises     PT Diagnosis:    PT Problem List:   PT Treatment Interventions:       PT Goals(Current goals can be found in the care plan section) Acute Rehab PT Goals PT Goal Formulation: No goals set, d/c therapy  Visit Information  Last PT Received On: 05/17/13 Assistance Needed: +1 History of Present Illness: 62 years old male admitted after cardiac arrest at home. Initial rhythm at EMS arrival was V fib. Down time about 15 minutes. Return to spontaneous circulation after multiple shocks, epinephrine and amiodarone. EKG with inferolateral STEMI. Cath  100% rca stent.       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 Communication Communication: Prefers language other than English (vietmanese)    Cognition  Cognition Arousal/Alertness: Awake/alert Behavior During Therapy: WFL for tasks assessed/performed Overall Cognitive Status: Within Functional Limits for tasks assessed    Extremity/Trunk Assessment Upper Extremity Assessment Upper Extremity Assessment: Overall WFL for tasks assessed Lower Extremity Assessment Lower Extremity Assessment: Overall WFL for tasks assessed Cervical / Trunk  Assessment Cervical / Trunk Assessment: Normal   Balance    End of Session PT - End of Session Activity Tolerance: Patient tolerated treatment well Patient left: in chair;with call bell/phone within reach;with family/visitor present Nurse Communication: Mobility status  GP     Toney Sang Beth 05/17/2013, 12:16 PM Delaney Meigs, PT [phone]

## 2013-05-18 LAB — BASIC METABOLIC PANEL
CO2: 22 mEq/L (ref 19–32)
Calcium: 8.7 mg/dL (ref 8.4–10.5)
GFR calc Af Amer: >90 mL/min (ref >90)
GFR calc non Af Amer: 79 mL/min — ABNORMAL LOW (ref >90)
Sodium: 140 mEq/L (ref 135–145)

## 2013-05-18 LAB — CBC
MCH: 30.4 pg (ref 26.0–34.0)
MCHC: 34.8 g/dL (ref 30.0–36.0)
Platelets: 323 10*3/uL (ref 150–400)
RBC: 3.58 MIL/uL — ABNORMAL LOW (ref 4.22–5.81)

## 2013-05-18 LAB — MAGNESIUM: Magnesium: 2.4 mg/dL (ref 1.5–2.5)

## 2013-05-18 LAB — TROPONIN I: Troponin I: 1.24 ng/mL (ref <0.30)

## 2013-05-18 MED ORDER — LOSARTAN POTASSIUM 50 MG PO TABS
50.0000 mg | ORAL_TABLET | Freq: Every day | ORAL | Status: DC
  Administered 2013-05-18 – 2013-05-19 (×2): 50 mg via ORAL
  Filled 2013-05-18 (×2): qty 1

## 2013-05-18 MED ORDER — SPIRONOLACTONE 25 MG PO TABS
25.0000 mg | ORAL_TABLET | Freq: Every day | ORAL | Status: DC
  Administered 2013-05-18 – 2013-05-19 (×2): 25 mg via ORAL
  Filled 2013-05-18 (×2): qty 1

## 2013-05-18 MED ORDER — PNEUMOCOCCAL VAC POLYVALENT 25 MCG/0.5ML IJ INJ
0.5000 mL | INJECTION | INTRAMUSCULAR | Status: AC
  Administered 2013-05-19: 0.5 mL via INTRAMUSCULAR
  Filled 2013-05-18: qty 0.5

## 2013-05-18 MED ORDER — ZOLPIDEM TARTRATE 5 MG PO TABS
5.0000 mg | ORAL_TABLET | Freq: Every evening | ORAL | Status: DC | PRN
  Administered 2013-05-18: 5 mg via ORAL
  Filled 2013-05-18: qty 1

## 2013-05-18 NOTE — Progress Notes (Signed)
Subjective:  Patient continues to complain of cough especially at night. Denies any chest pain or shortness of breath. Denies any fever or chills overall feels better  Objective:  Vital Signs in the last 24 hours: Temp:  [97.4 F (36.3 C)-98.4 F (36.9 C)] 97.8 F (36.6 C) (09/03 0711) Pulse Rate:  [84-101] 89 (09/03 1144) Resp:  [18] 18 (09/03 0711) BP: (123-160)/(79-97) 150/97 mmHg (09/03 1144) SpO2:  [99 %-100 %] 100 % (09/03 0711)  Intake/Output from previous day: 09/02 0701 - 09/03 0700 In: 600 [P.O.:600] Out: -  Intake/Output from this shift: Total I/O In: 240 [P.O.:240] Out: -   Physical Exam: Neck: no adenopathy, no carotid bruit, no JVD and supple, symmetrical, trachea midline Lungs: clear to auscultation bilaterally Heart: regular rate and rhythm, S1, S2 normal and Soft systolic murmur noted Abdomen: soft, non-tender; bowel sounds normal; no masses,  no organomegaly Extremities: extremities normal, atraumatic, no cyanosis or edema  Lab Results:  Recent Labs  05/17/13 0544 05/18/13 0528  WBC 8.4 9.2  HGB 10.6* 10.9*  PLT 303 323    Recent Labs  05/17/13 0544 05/18/13 0528  NA 138 140  K 3.3* 4.1  CL 103 106  CO2 22 22  GLUCOSE 152* 155*  BUN 23 23  CREATININE 0.92 1.00    Recent Labs  05/18/13 0528  TROPONINI 1.24*   Hepatic Function Panel  Recent Labs  05/16/13 0359  PROT 7.0  ALBUMIN 2.7*  AST 44*  ALT 60*  ALKPHOS 66  BILITOT 1.0  BILIDIR 0.2  IBILI 0.8   No results found for this basename: CHOL,  in the last 72 hours No results found for this basename: PROTIME,  in the last 72 hours  Imaging: Imaging results have been reviewed and No results found.  Cardiac Studies:  Assessment/Plan:  Status post Acute inferoposterolateral wall myocardial infarction status post PCI 100% occluded large RCA with excellent results status post relook angiogram yesterday noted to have patent RCA with TIMI 3 distal flow  Status post V. fib  cardiac arrest  Status post vent dependent respiratory failure  Acute anemia rule out GI loss  Status post Cardiogenic shock  Hypertension  New-onset diabetes mellitus  Hypercholesteremia  Remote tobacco abuse  Status post renal insufficiency improved  Bronchitis Plan DC ACE inhibitors change it to ARB Start Aldactone as per orders Increase ambulation possible discharge tomorrow if stable  LOS: 9 days    Gracia Saggese N 05/18/2013, 12:03 PM

## 2013-05-18 NOTE — Plan of Care (Signed)
Problem: Discharge Progression Outcomes Goal: Activity appropriate for discharge plan Outcome: Progressing Notified Phase I Cardiac Rehab of patient's status and PT sign-off

## 2013-05-18 NOTE — Progress Notes (Signed)
Pt. And family have concerns as pt. Has not been sleeping for past 2 to 3 days and would like something for sleep.  Dr. Sharyn Lull paged.  Family at bedside, will continue to monitor.    Thane Edu, RN

## 2013-05-18 NOTE — Progress Notes (Signed)
Patient's family is concerned. They state that patient is hallucinating about things in the room. VSS. O2 sat 98% on room air. CBG ok. Daughter states that he states he is seeing water under the sink and black dust blowing around room. Dr. Sharyn Lull paged; awaiting call back.Mamie Levers

## 2013-05-18 NOTE — Progress Notes (Signed)
CARDIAC REHAB PHASE I   PRE:  Rate/Rhythm: 81 SR  BP:  Supine:   Sitting: 140/90  Standing:    SaO2: 99%RA  MODE:  Ambulation: 550 ft   POST:  Rate/Rhythm: 86  BP:  Supine:   Sitting: 138/82  Standing:    SaO2: 100%RA 1400-1520 Pt signed form for daughter to interpret for him. Reviewed MI ed,NTG use, ex ed,stent/brilinta and heart healthy and diabetic diets. This is a lot of information at one time. Daughters to read information and let us know if questions tomorrow. Daughter states that a lot of salt is used in their diets. Discussed that pt not to have a lot of salt due to low pumping of heart. Discussed CRP 2 [place]. Permission given to refer. Left financial form for CRP 2 with daughter. Pt walked 550 ft with hand held asst. C/o legs sore after walk. No CP. Would recommend Home Health RN to help re enforce diet.   Luetta Nutting, RN BSN  05/18/2013 3:29 PM

## 2013-05-18 NOTE — Care Management Note (Addendum)
    Page 1 of 2   05/19/2013     2:33:16 PM   CARE MANAGEMENT NOTE 05/19/2013  Patient:  Dustin Vang, Dustin Vang   Account Number:  [id number]  Date Initiated:  05/09/2013  Documentation initiated by:  Junius Creamer  Subjective/Objective Assessment:   adm w cardiac arrest, on vent     Action/Plan:   lives w fam, no ins listed.   Anticipated DC Date:  05/19/2013   Anticipated DC Plan:  HOME W HOME HEALTH SERVICES  In-house referral  Financial Counselor      DC Planning Services  CM consult      [facility]  HOME HEALTH   Choice offered to / List presented to:  C-1 Patient        HH arranged  HH-1 RN      [facility] agency  [facility]   Status of service:  Completed, signed off Medicare Important Message given?   (If response is "NO", the following Medicare IM given date fields will be blank) Date Medicare IM given:   Date Additional Medicare IM given:    Discharge Disposition:  HOME W HOME HEALTH SERVICES  Per UR Regulation:  Reviewed for med. necessity/level of care/duration of stay  If discussed at Long Length of Stay Meetings, dates discussed:    Comments:  05/19/13 Kaegan Stigler,RN,BSN [phone] PT FOR DC HOME TODAY.  MATCH LETTER GIVEN TO PT WITH EXPLANATION OF PROGRAM BENEFITS.  REFERRAL TO [facility] FOR HHRN, AS [facility] UNABLE TO TAKE PT.  [place] CARD GIVEN TO PT AND REVIEWED WITH SON IN LAW, WHO WAS INTERPRETING.  COPY OF PT ASSISTANCE FORMS GIVEN TO PT FOR FOLLOW UP. PT/FAMILY APPRECIATIVE OF ALL HELP GIVEN.  05/18/13 Ridley Dileo,RN,BSN [phone] MD COMPLETED [place] PT ASSISTANCE PAPERWORK; FAXED TO [phone].  PLANNING DC HOME WITH WIFE AND FAMILY TOMORROW.  PT/OT RECOMMENDING NO FOLLOW UP.  FEEL PT NEEDS HHRN FOR MEDICATION COMPLIANCE AND DIABETIC TEACHING. DAUGHTER REQUESTS TO USE [facility]; M. MANLEY WITH [facility] TO CHECK WITH MGMT TO SEE IF THEY CAN TAKE S/P PT.  MEDICAID APPLICATION COMPLETED TODAY, AS SCHEDULED.  05/17/13 Sherese Heyward,RN,BSN [phone] MET WITH  PT AND FAMILY TO DISCUSS DC NEEDS.  DAUGHTER KALIE AT BEDSIDE (PHONE [phone]) HAS BROUGHT ALL NEEDED DOCUMENTATION FOR COMPLETION OF [place] [place] PATIENT ASSISTANCE PROGRAM.  MADE COPIES OF FINANCIAL INFORMATION, AND COMPLETED PT SECTION OF FORMS.  PT IS ELIGIBLE FOR MATCH MED ASSISTANCE PROGRAM AT DC.  WILL PROVIDE LETTER PT CAN TAKE TO PHARMACY TO RECEIVE EACH DC MED FOR $3/RX. CONTACTED FINANCIAL COUNSELOR TO SPEAK WITH PT/FAMILY REGARDING MEDICAID APPLICATION AND DISABILITY.  PLAN TO MEET WITH FAMILY ON WED 9/3 AT 1PM, PER FINANCIAL COUNSELOR.  8/25 1411 debbie dowell rn,bsn left brilinta 30day free and copay assist card in shadow chart. left pt assist form for brilinta in shadow chart.

## 2013-05-18 NOTE — Evaluation (Signed)
Occupational Therapy Evaluation Patient Details Name: Dustin Vang MRN: [id number] DOB: 1951-08-04 Today's Date: 05/18/2013 Time: 8469-6295 OT Time Calculation (min): 17 min  OT Assessment / Plan / Recommendation History of present illness 62 years old male admitted after cardiac arrest at home. Initial rhythm at EMS arrival was V fib. Down time about 15 minutes. Return to spontaneous circulation after multiple shocks, epinephrine and amiodarone. EKG with inferolateral STEMI. Cath  100% rca stent.    Clinical Impression   Pt does present with overall weakness and poor endurance. Pt does not require skilled OT services at this time; however it is recommended pt perform daily light activity ie wallking in the hallways with supervision to build up activity tolerance and overall strength. Wife and pt's friend present in room for eval. Continue with cardiac rehab for further endurance training.     OT Assessment  Patient does not need any further OT services    Follow Up Recommendations  No OT follow up    Barriers to Discharge      Equipment Recommendations       Recommendations for Other Services    Frequency       Precautions / Restrictions Precautions Precautions: None Restrictions Weight Bearing Restrictions: No   Pertinent Vitals/Pain No reports of pain     ADL  Toilet Transfer: Simulated;Min guard Toilet Transfer Method: Sit to stand Toileting - Clothing Manipulation and Hygiene: Simulated;Set up Transfers/Ambulation Related to ADLs: Steady A for functional ambulation in hallway. pt reports fatigue with all physical activity including ADL tasks. ADL Comments: Pt can perform all simulated ADL with supervision to steady A.         Visit Information  Last OT Received On: 05/18/13 Assistance Needed: +1 History of Present Illness: 62 years old male admitted after cardiac arrest at home. Initial rhythm at EMS arrival was V fib. Down time about 15 minutes. Return to  spontaneous circulation after multiple shocks, epinephrine and amiodarone. EKG with inferolateral STEMI. Cath  100% rca stent.        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[employer] and keeps 2 grandkids during the day Communication Communication: Prefers language other than English         Vision/Perception Vision - History Baseline Vision: No visual deficits Vision - Assessment Eye Alignment: Within Chemical engineer Perception: Within Functional Limits Praxis Praxis: Intact   Cognition  Cognition Arousal/Alertness: Awake/alert Behavior During Therapy: WFL for tasks assessed/performed Overall Cognitive Status: Within Functional Limits for tasks assessed    Extremity/Trunk Assessment Upper Extremity Assessment Upper Extremity Assessment: Overall WFL for tasks assessed Lower Extremity Assessment Lower Extremity Assessment: Overall WFL for tasks assessed Cervical / Trunk Assessment Cervical / Trunk Assessment: Normal     Mobility Transfers Transfers: Sit to Stand;Stand to Sit Sit to Stand: 6: Modified independent (Device/Increase time) Stand to Sit: 6: Modified independent (Device/Increase time) Details for Transfer Assistance: mod I     Exercise     Balance  overall supervision due to fatigue   End of Session OT - End of Session Activity Tolerance: Patient limited by fatigue Patient left: in chair;with call bell/phone within reach;with family/visitor  present  GO     Roney Mans [facility] 05/18/2013, 11:45 AM

## 2013-05-19 LAB — GLUCOSE, CAPILLARY
Glucose-Capillary: 105 mg/dL — ABNORMAL HIGH (ref 70–99)
Glucose-Capillary: 128 mg/dL — ABNORMAL HIGH (ref 70–99)

## 2013-05-19 MED ORDER — LOSARTAN POTASSIUM 50 MG PO TABS: 50.0000 mg | ORAL_TABLET | Freq: Every day | ORAL | Status: DC

## 2013-05-19 MED ORDER — LEVOFLOXACIN 750 MG PO TABS: 750.0000 mg | ORAL_TABLET | Freq: Every day | ORAL | Status: DC

## 2013-05-19 MED ORDER — NITROGLYCERIN 0.4 MG SL SUBL: 0.4000 mg | SUBLINGUAL_TABLET | SUBLINGUAL | Status: DC | PRN

## 2013-05-19 MED ORDER — METFORMIN HCL ER 500 MG PO TB24: 500.0000 mg | ORAL_TABLET | Freq: Every day | ORAL | Status: DC

## 2013-05-19 MED ORDER — METOPROLOL TARTRATE 50 MG PO TABS: 50.0000 mg | ORAL_TABLET | Freq: Two times a day (BID) | ORAL | Status: DC

## 2013-05-19 MED ORDER — TICAGRELOR 90 MG PO TABS: 90.0000 mg | ORAL_TABLET | Freq: Two times a day (BID) | ORAL | Status: DC

## 2013-05-19 MED ORDER — ATORVASTATIN CALCIUM 80 MG PO TABS: 80.0000 mg | ORAL_TABLET | Freq: Every day | ORAL | Status: DC

## 2013-05-19 MED ORDER — SPIRONOLACTONE 25 MG PO TABS: 25.0000 mg | ORAL_TABLET | Freq: Every day | ORAL | Status: DC

## 2013-05-19 MED ORDER — ASPIRIN 81 MG PO TBEC: 81.0000 mg | DELAYED_RELEASE_TABLET | Freq: Every day | ORAL | Status: DC

## 2013-05-19 NOTE — Progress Notes (Signed)
[id number] Checked with daughter. No questions re education I did yesterday. Pt for D/C. Luetta Nutting RNBSN

## 2013-05-19 NOTE — Discharge Summary (Signed)
  Discharge summary dictated on 05/19/2013 dictation number is [phone]

## 2013-05-19 NOTE — Progress Notes (Signed)
Pt discharge home. Discharge instructions reviewed with pt son n law. Questions answered. Son n law vu. Prescriptions given. Pt left unit via wc with family. No distress noted.

## 2013-05-19 NOTE — Discharge Summary (Signed)
Dustin Vang, Dustin Vang                 ACCOUNT NO.:  [id number]  MEDICAL RECORD NO.:  [id number]  LOCATION:  2W09C                        FACILITY:  [facility]  PHYSICIAN:  [name]. Sharyn Lull, M.D. DATE OF BIRTH:  1951-03-19  DATE OF ADMISSION:  05/09/2013 DATE OF DISCHARGE:  05/19/2013                              DISCHARGE SUMMARY   ADMITTING DIAGNOSES: 1. Acute inferoposterolateral wall myocardial infarction. 2. Status post ventricular fibrillation cardiac arrest. 3. Acute respiratory failure secondary to above. 4. Cardiogenic shock. 5. Hypertension. 6. New-onset diabetes mellitus. 7. Hypercholesteremia. 8. Remote tobacco abuse.  FINAL DIAGNOSES: 1. Status post acute inferoposterolateral wall myocardial infarction     status post percutaneous coronary intervention to 100% occluded     right coronary artery/ 2. Status post ventricular fibrillation, cardiac arrest. 3. Status post hypotensive shock/metabolic     acidosis/bradyarrhythmias/hypoxia. 4. Hypertension. 5. New-onset diabetes mellitus. 6. Hypercholesteremia. 7. Resolving bronchitis. 8. Status post acute renal insufficiency. 9. Remote tobacco abuse.  DISCHARGE HOME MEDICATIONS: 1. Aspirin 81 mg 1 tablet daily. 2. Brilinta 90 mg 1 tablet twice daily. 3. Atorvastatin 80 mg 1 tablet daily. 4. Levofloxacin 750 mg 1 tablet daily for 5 more days. 5. Losartan 50 mg 1 tablet daily. 6. Metformin 500 mg 1 tablet daily with breakfast. 7. Metoprolol tartrate 50 mg twice daily. 8. Aldactone 25 mg 1 tablet daily. 9. Nitrostat sublingual use as directed.  DIET:  Low salt, low cholesterol, 1800 calories ADA diet.  The patient has been advised to monitor blood sugar and blood pressure daily.  Post- PTCA stent instructions have been given.  Follow up with me in 1 week. The patient will be scheduled for phase 2 cardiac rehab as an outpatient.  CONDITION ON DISCHARGE:  Stable.  BRIEF HISTORY AND HOSPITAL COURSE:  Dustin Vang is a  62 year old [place] male with past medical history significant for hypertension, hypercholesteremia, remote tobacco abuse.  He complained of retrosternal chest pain, radiating to both arm associated with mild shortness of breath, came to the ER with family.  Upon reaching the ER, the patient's chest pain resolved and went back home without seeing in the ER.  The patient re-started having severe chest pain, radiating to both arm associated with shortness of breath and suddenly collapsed at home.  CPR was initiated by family members.  The EMS was called.  The patient was noted to be in VFib, requiring multiple shocks and epinephrine.  In the ER, the patient was intubated.  EKG done in the ER showed sinus tach with more than 10-mm ST-elevation in inferolateral leads with reciprocal changes in lead I, aVL, and ST depression in V1 and V2, suggestive of acute inferoposterolateral wall MI.  The patient presently intubated in the ED on IV amiodarone, Versed, and fentanyl drip.  The patient was emergently taken to the cath lab and was noted to have blood pressure in 60s systolic with heart rate in the 40s.  The patient was sedated and unresponsive.  PHYSICAL EXAMINATION:  HEENT:  His conjunctivae normal. NECK:  Supple.  No JVD. LUNGS:  Clear to auscultation anterolaterally. CARDIOVASCULAR:  S1, S2 was soft.  He was bradycardic. ABDOMEN:  Soft. EXTREMITIES:  There  is no clubbing, cyanosis, or edema.  LABORATORY DATA:  His initial ABGs; pH 7.19, pCO2 was 48.3, pO2 205, bicarb of 20.  Sodium was 144, potassium 3.4, BUN 32, creatinine 1.35, glucose was 269.  Hemoglobin was 8.6.  Initial electrolytes; sodium 134, potassium 2.4, BUN was 34, creatinine 1.66.  His blood sugar was 523. Hemoglobin was 12.5, hematocrit 37, white count of 20.3.  Lactic acid level was 10.35.  Repeat electrolytes on May 09, 2013; sodium 138, potassium 4.6, BUN 25, creatinine 1.08.  His troponin I was above  20. Hemoglobin was 12.3, hematocrit 33.9, white count of 6.9.  His last labs on May 18, 2013, sodium was 140, potassium 4.1, BUN 23, creatinine 1.0.  His blood sugar was 155.  Troponin I has trended down to 1.24. Hemoglobin is 10.9, hematocrit 31.3, white count of 9.2, platelet count 323,000.  IMAGING STUDIES:  Chest x-ray done on May 16, 2013, showed no acute abnormalities.  EKG done on May 11, 2013, showed sinus tachycardia with Q-waves in inferior leads with ST elevation and ST depression in V2 to V3, suggestive of acute inferoposterior wall MI.  BRIEF HOSPITAL COURSE:  The patient was emergently taken to the cath lab and underwent left cardiac cath with selective left and right coronary angiography and PTCA stenting and aspiration thrombectomy to 100% occluded RCA with excellent results.  The patient tolerated procedure well.  The patient was transferred to the CCU in stable condition.  The patient remained intubated and sedated.  Again on May 11, 2013, the patient became hypotensive, acidotic, and bradycardic, and was noted to be hypoxic.  Repeat EKG done showed sinus tach with more ST elevation in inferior leads, suggestive of possible subacute thrombosis and occlusion of RCA.  The patient was emergently taken to cath lab and was noted to have patent RCA with TIMI grade 3 distal flow.  The patient was treated aggressively for possible aspiration pneumonia and also was diuresed and was subsequently extubated.  Phase 1 Cardiac Rehab was called.  The patient has been ambulating in hallway without any problems.  His blood sugar is fairly well controlled.  The patient is alert, awake, and oriented to time, place, and person.  The patient did not have any significant neuro deficit.  His both the groins were stable with no evidence of hematoma or bruit.  The patient will be discharged home on above medications and will be followed up in my office in 1 week.     [name]. Sharyn Lull, M.D.     [facility]  D:  05/19/2013  T:  05/19/2013  Job:  [id number]

## 2013-05-30 ENCOUNTER — Emergency Department (HOSPITAL_COMMUNITY)
Admission: EM | Admit: 2013-05-30 | Discharge: 2013-05-30 | Disposition: A | Discharge status: Home / Self Care | Payer: Medicaid Other | Attending: Emergency Medicine | Admitting: Emergency Medicine

## 2013-05-30 ENCOUNTER — Encounter (HOSPITAL_COMMUNITY): Payer: Self-pay | Admitting: *Deleted

## 2013-05-30 DIAGNOSIS — Z87891 Personal history of nicotine dependence: Secondary | ICD-10-CM | POA: Insufficient documentation

## 2013-05-30 DIAGNOSIS — Z79899 Other long term (current) drug therapy: Secondary | ICD-10-CM | POA: Insufficient documentation

## 2013-05-30 DIAGNOSIS — E119 Type 2 diabetes mellitus without complications: Secondary | ICD-10-CM | POA: Insufficient documentation

## 2013-05-30 DIAGNOSIS — Z7982 Long term (current) use of aspirin: Secondary | ICD-10-CM | POA: Insufficient documentation

## 2013-05-30 DIAGNOSIS — B029 Zoster without complications: Secondary | ICD-10-CM

## 2013-05-30 DIAGNOSIS — I252 Old myocardial infarction: Secondary | ICD-10-CM | POA: Insufficient documentation

## 2013-05-30 DIAGNOSIS — I10 Essential (primary) hypertension: Secondary | ICD-10-CM | POA: Insufficient documentation

## 2013-05-30 MED ORDER — ACYCLOVIR 400 MG PO TABS: 800.0000 mg | ORAL_TABLET | Freq: Every day | ORAL | Status: DC

## 2013-05-30 NOTE — ED Provider Notes (Signed)
CSN: [id number]     Arrival date & time 05/30/13  1739 History   First MD Initiated Contact with Patient 05/30/13 1810     Chief Complaint  Patient presents with  . Leg Pain   (Consider location/radiation/quality/duration/timing/severity/associated sxs/prior Treatment) Patient is a 62 y.o. male presenting with leg pain. The history is provided by the patient and a relative.  Leg Pain Associated symptoms: no back pain and no fever    patient with history of right thigh pain for 4 days and now has a rash that's broken out. Rash is still painful but not as painful as was prior to the rash. Patient did have chickenpox he was younger. Patient was recently in the hospital for a myocardial infarction and had a stent placed. That was back in the end of August. Patient been doing well until this started. The patient does not like taking any pain medicines other than Tylenol. Patient states that the pain in the right thigh is 8/10 it is painful only where the rash is. Described as sharp burning ache.  Past Medical History  Diagnosis Date  . Diabetes mellitus without complication per family report  . Myocardial infarction 05/09/2013  . Hypertension   . Anginal pain    Past Surgical History  Procedure Laterality Date  . No past surgeries     No family history on file. History  Substance Use Topics  . Smoking status: Former Smoker -- 0.50 packs/day for 30 years    Types: Cigarettes    Quit date: 09/15/1998  . Smokeless tobacco: Never Used  . Alcohol Use: No    Review of Systems  Constitutional: Negative for fever.  HENT: Negative for congestion.   Eyes: Negative for pain.  Respiratory: Negative for shortness of breath.   Cardiovascular: Negative for chest pain.  Gastrointestinal: Negative for abdominal pain.  Musculoskeletal: Negative for back pain.  Skin: Positive for rash.  Allergic/Immunologic: Negative for immunocompromised state.  Neurological: Negative for headaches.   Hematological: Does not bruise/bleed easily.  Psychiatric/Behavioral: Negative for confusion.    Allergies  Precedex  Home Medications   Current Outpatient Rx  Name  Route  Sig  Dispense  Refill  . acetaminophen (TYLENOL) 500 MG tablet   Oral   Take 500 mg by mouth daily as needed for pain.         Marland Kitchen aspirin EC 81 MG EC tablet   Oral   Take 1 tablet (81 mg total) by mouth daily.   30 tablet   3   . atorvastatin (LIPITOR) 80 MG tablet   Oral   Take 1 tablet (80 mg total) by mouth daily at 6 PM.   30 tablet   3   . losartan (COZAAR) 50 MG tablet   Oral   Take 1 tablet (50 mg total) by mouth daily.   30 tablet   3   . metFORMIN (GLUCOPHAGE-XR) 500 MG 24 hr tablet   Oral   Take 1 tablet (500 mg total) by mouth daily with breakfast.   30 tablet   3   . metoprolol (LOPRESSOR) 50 MG tablet   Oral   Take 1 tablet (50 mg total) by mouth 2 (two) times daily.   60 tablet   3   . nitroGLYCERIN (NITROSTAT) 0.4 MG SL tablet   Sublingual   Place 1 tablet (0.4 mg total) under the tongue every 5 (five) minutes as needed for chest pain.   25 tablet   12   .  spironolactone (ALDACTONE) 25 MG tablet   Oral   Take 1 tablet (25 mg total) by mouth daily.   30 tablet   3   . Ticagrelor (BRILINTA) 90 MG TABS tablet   Oral   Take 1 tablet (90 mg total) by mouth 2 (two) times daily.   60 tablet   11   . acyclovir (ZOVIRAX) 400 MG tablet   Oral   Take 2 tablets (800 mg total) by mouth 5 (five) times daily.   50 tablet   0   . levofloxacin (LEVAQUIN) 750 MG tablet   Oral   Take 750 mg by mouth daily. 5 day course started 05/21/13          BP 107/87  Pulse 84  Temp(Src) 98.4 F (36.9 C) (Oral)  Resp 18  SpO2 98% Physical Exam  Nursing note and vitals reviewed. Constitutional: He is oriented to person, place, and time. He appears well-developed and well-nourished. No distress.  HENT:  Head: Normocephalic and atraumatic.  Mouth/Throat: Oropharynx is clear and  moist.  Eyes: Conjunctivae and EOM are normal. Pupils are equal, round, and reactive to light.  Neck: Normal range of motion.  Cardiovascular: Normal rate, regular rhythm and normal heart sounds.   No murmur heard. Pulmonary/Chest: Effort normal and breath sounds normal. No respiratory distress.  Abdominal: Soft. Bowel sounds are normal. There is no tenderness.  Neurological: He is alert and oriented to person, place, and time. No cranial nerve deficit. He exhibits normal muscle tone. Coordination normal.  Skin: Skin is warm. Rash noted.  Patient with right thigh fascicular rash does radiate down into the right leg and down to the ankle. Consistent with zoster.    ED Course  Procedures (including critical care time) Labs Review Labs Reviewed - No data to display Imaging Review No results found.  MDM   1. Shingles     Patient with right-sided thigh and leg herpes zoster patient had pain in that distribution for a few days prior to the rash breaking out. Classic for zoster. No complicating factors. No febrile illness.  Shelda Jakes, MD 05/30/13 [phone]

## 2013-05-30 NOTE — ED Notes (Signed)
Pt was seen here last week for mi and now here with right leg tingling and appears to have shingles to knee and upper thigh.  Blisters

## 2013-05-30 NOTE — ED Notes (Signed)
Pt presents to ED with pink, blistering sores to inner upper legs and outer right hip area. Pt's daughter reports that the blisters started appearing 2 days ago. Pt denies pain or itching. No drainage noted. Pt reports difficulty walking x1 week.  Pt did have tingling to outer right leg x1 week and PCP told pt to take tylenol for pain. Pt is resting comfortably and in NAD. Family at bedside.

## 2013-05-30 NOTE — ED Notes (Addendum)
Pt presents with red, blistering sores to inner aspect of right upper leg that are progressively spreading x 1 week. Pt denies pain or itching. Pt also has scabbed area on right knee, but states that is from hospital visit and unsure of what happened. No purulent drainage, s/s infection noted to this site. Pt has had difficulty walking x 1 week. Pt's family say his difficulty walking has progressed x 1 week. Denies SOB, CP, N/V. NAD noted. Family at bedside.

## 2013-06-15 DEATH — deceased

## 2013-10-16 DEATH — deceased

## 2014-05-10 ENCOUNTER — Encounter (HOSPITAL_COMMUNITY): Payer: Self-pay | Admitting: Emergency Medicine

## 2014-05-10 ENCOUNTER — Emergency Department (HOSPITAL_COMMUNITY)
Admission: EM | Admit: 2014-05-10 | Discharge: 2014-05-10 | Disposition: A | Discharge status: Home / Self Care | Payer: Medicaid Other | Attending: Emergency Medicine | Admitting: Emergency Medicine

## 2014-05-10 DIAGNOSIS — I10 Essential (primary) hypertension: Secondary | ICD-10-CM | POA: Insufficient documentation

## 2014-05-10 DIAGNOSIS — Z79899 Other long term (current) drug therapy: Secondary | ICD-10-CM | POA: Diagnosis not present

## 2014-05-10 DIAGNOSIS — Z7982 Long term (current) use of aspirin: Secondary | ICD-10-CM | POA: Diagnosis not present

## 2014-05-10 DIAGNOSIS — E119 Type 2 diabetes mellitus without complications: Secondary | ICD-10-CM | POA: Insufficient documentation

## 2014-05-10 DIAGNOSIS — M7981 Nontraumatic hematoma of soft tissue: Secondary | ICD-10-CM | POA: Diagnosis present

## 2014-05-10 DIAGNOSIS — I209 Angina pectoris, unspecified: Secondary | ICD-10-CM | POA: Diagnosis not present

## 2014-05-10 DIAGNOSIS — Z87891 Personal history of nicotine dependence: Secondary | ICD-10-CM | POA: Insufficient documentation

## 2014-05-10 DIAGNOSIS — I252 Old myocardial infarction: Secondary | ICD-10-CM | POA: Insufficient documentation

## 2014-05-10 DIAGNOSIS — Z7902 Long term (current) use of antithrombotics/antiplatelets: Secondary | ICD-10-CM | POA: Insufficient documentation

## 2014-05-10 DIAGNOSIS — T148XXA Other injury of unspecified body region, initial encounter: Secondary | ICD-10-CM

## 2014-05-10 LAB — CBC WITH DIFFERENTIAL/PLATELET
BASOS ABS: 0 10*3/uL (ref 0.0–0.1)
Basophils Relative: 0 % (ref 0–1)
Eosinophils Absolute: 0.8 10*3/uL — ABNORMAL HIGH (ref 0.0–0.7)
Eosinophils Relative: 11 % — ABNORMAL HIGH (ref 0–5)
HEMATOCRIT: 36.6 % — AB (ref 39.0–52.0)
Hemoglobin: 12.2 g/dL — ABNORMAL LOW (ref 13.0–17.0)
Lymphocytes Relative: 27 % (ref 12–46)
Lymphs Abs: 2 10*3/uL (ref 0.7–4.0)
MCH: 31 pg (ref 26.0–34.0)
MCHC: 33.3 g/dL (ref 30.0–36.0)
MCV: 92.9 fL (ref 78.0–100.0)
MONO ABS: 0.7 10*3/uL (ref 0.1–1.0)
Monocytes Relative: 9 % (ref 3–12)
NEUTROS ABS: 4 10*3/uL (ref 1.7–7.7)
Neutrophils Relative %: 53 % (ref 43–77)
PLATELETS: 277 10*3/uL (ref 150–400)
RBC: 3.94 MIL/uL — ABNORMAL LOW (ref 4.22–5.81)
RDW: 12.5 % (ref 11.5–15.5)
WBC: 7.5 10*3/uL (ref 4.0–10.5)

## 2014-05-10 LAB — COMPREHENSIVE METABOLIC PANEL
ALBUMIN: 4 g/dL (ref 3.5–5.2)
ALT: 29 U/L (ref 0–53)
AST: 23 U/L (ref 0–37)
Alkaline Phosphatase: 99 U/L (ref 39–117)
Anion gap: 15 (ref 5–15)
BILIRUBIN TOTAL: 0.2 mg/dL — AB (ref 0.3–1.2)
BUN: 44 mg/dL — ABNORMAL HIGH (ref 6–23)
CHLORIDE: 101 meq/L (ref 96–112)
CO2: 25 mEq/L (ref 19–32)
CREATININE: 1.55 mg/dL — AB (ref 0.50–1.35)
Calcium: 10.1 mg/dL (ref 8.4–10.5)
GFR calc Af Amer: 54 mL/min — ABNORMAL LOW (ref >90)
GFR, EST NON AFRICAN AMERICAN: 46 mL/min — AB (ref >90)
Glucose, Bld: 115 mg/dL — ABNORMAL HIGH (ref 70–99)
Potassium: 4.5 mEq/L (ref 3.7–5.3)
Sodium: 141 mEq/L (ref 137–147)
Total Protein: 8 g/dL (ref 6.0–8.3)

## 2014-05-10 LAB — APTT: aPTT: 30 seconds (ref 24–37)

## 2014-05-10 LAB — PROTIME-INR
INR: 1.03 (ref 0.00–1.49)
PROTHROMBIN TIME: 13.5 s (ref 11.6–15.2)

## 2014-05-10 NOTE — ED Notes (Signed)
Pt reports bruising easily x 3 weeks. First had bruise to right upper arm which is now healed but now has large bruise to left side and small bruise to left thigh. Denies any injuries. Only blood thinner is aspirin .

## 2014-05-10 NOTE — ED Provider Notes (Signed)
CSN: [id number]     Arrival date & time 05/10/14  1736 History   First MD Initiated Contact with Patient 05/10/14 2129     Chief Complaint  Patient presents with  . Bleeding/Bruising     (Consider location/radiation/quality/duration/timing/severity/associated sxs/prior Treatment) HPI Comments: Patient is a 63 year old male with history of myocardial infarction, diabetes, hypertension who presents to the emergency department with worsening bruising. He reports that this began one month ago. Initially she felt as though it was related to the blood pressure cuff at his doctor's appointment. A large bruise was generated on his right arm. This has since resolved. He has a bruise on his left flank, right leg, left arm. He has no trauma to these areas. He reports that he otherwise feels very well. No abdominal pain, nausea, vomiting, diarrhea, chest pain, shortness of breath, headache, lightheadedness. He takes a daily 81 mg aspirin. He does not take any additional blood thinner. No new medications, diet changes. No history of liver disease.  The history is provided by the patient. No language interpreter was used.    Past Medical History  Diagnosis Date  . Diabetes mellitus without complication per family report  . Myocardial infarction 05/09/2013  . Hypertension   . Anginal pain    Past Surgical History  Procedure Laterality Date  . No past surgeries     History reviewed. No pertinent family history. History  Substance Use Topics  . Smoking status: Former Smoker -- 0.50 packs/day for 30 years    Types: Cigarettes    Quit date: 09/15/1998  . Smokeless tobacco: Never Used  . Alcohol Use: No    Review of Systems  Constitutional: Negative for fever.  Respiratory: Negative for shortness of breath.   Cardiovascular: Negative for chest pain.  Gastrointestinal: Negative for nausea, vomiting and abdominal pain.  Hematological: Bruises/bleeds easily.  All other systems reviewed and are  negative.     Allergies  Precedex  Home Medications   Prior to Admission medications   Medication Sig Start Date End Date Taking? Authorizing Provider  acetaminophen (TYLENOL) 500 MG tablet Take 500 mg by mouth daily as needed for pain.   Yes Historical Provider, MD  aspirin EC 81 MG EC tablet Take 1 tablet (81 mg total) by mouth daily. 05/19/13  Yes Robynn Pane, MD  atorvastatin (LIPITOR) 80 MG tablet Take 1 tablet (80 mg total) by mouth daily at 6 PM. 05/19/13  Yes Robynn Pane, MD  losartan-hydrochlorothiazide (HYZAAR) 100-12.5 MG per tablet Take 1 tablet by mouth daily.   Yes Historical Provider, MD  metFORMIN (GLUCOPHAGE-XR) 500 MG 24 hr tablet Take 1 tablet (500 mg total) by mouth daily with breakfast. 05/19/13  Yes Robynn Pane, MD  metoprolol (LOPRESSOR) 50 MG tablet Take 1 tablet (50 mg total) by mouth 2 (two) times daily. 05/19/13  Yes Robynn Pane, MD  spironolactone (ALDACTONE) 25 MG tablet Take 1 tablet (25 mg total) by mouth daily. 05/19/13  Yes Robynn Pane, MD  Ticagrelor (BRILINTA) 90 MG TABS tablet Take 1 tablet (90 mg total) by mouth 2 (two) times daily. 05/19/13  Yes Robynn Pane, MD   BP 137/93  Pulse 69  Temp(Src) 98 F (36.7 C) (Oral)  Resp 18  SpO2 98% Physical Exam  Nursing note and vitals reviewed. Constitutional: He is oriented to person, place, and time. He appears well-developed and well-nourished. No distress.  Well appearing  HENT:  Head: Normocephalic and atraumatic.  Right Ear: External ear  normal.  Left Ear: External ear normal.  Nose: Nose normal.  Eyes: Conjunctivae and EOM are normal. Pupils are equal, round, and reactive to light.  Neck: Normal range of motion. No tracheal deviation present.  Cardiovascular: Normal rate, regular rhythm and normal heart sounds.   Pulmonary/Chest: Effort normal and breath sounds normal. No stridor.  Abdominal: Soft. He exhibits no distension. There is no tenderness. There is no rigidity and no  guarding.    Healing contusion to left flank  Musculoskeletal: Normal range of motion.  Neurological: He is alert and oriented to person, place, and time.  Skin: Skin is warm and dry. He is not diaphoretic.  Small bruise to left forearm, appears to be healing Small bruise to right proximal lower leg Small bruise to left medial thigh  Psychiatric: He has a normal mood and affect. His behavior is normal.    ED Course  Procedures (including critical care time) Labs Review Labs Reviewed  CBC WITH DIFFERENTIAL - Abnormal; Notable for the following:    RBC 3.94 (*)    Hemoglobin 12.2 (*)    HCT 36.6 (*)    Eosinophils Relative 11 (*)    Eosinophils Absolute 0.8 (*)    All other components within normal limits  COMPREHENSIVE METABOLIC PANEL - Abnormal; Notable for the following:    Glucose, Bld 115 (*)    BUN 44 (*)    Creatinine, Ser 1.55 (*)    Total Bilirubin 0.2 (*)    GFR calc non Af Amer 46 (*)    GFR calc Af Amer 54 (*)    All other components within normal limits  PROTIME-INR  APTT    Imaging Review No results found.   EKG Interpretation None      MDM   Final diagnoses:  Bruising    Patient presents to ED for evaluation of worsening bruises. He denies trauma to the areas. He takes 81 mg of ASA, but no other blood thinners. Patient's labs look unremarkable here. Normal platelets, INR, aPTT, H&H. No gray turner sign. No concern for intra abdominal bleeding. Patient appears well and otherwise feels quite well. He was encouraged to see a PCP for further testing. Resource guide was given. Discussed reasons to return to ED immediately. Vital signs stable for discharge. Dr. Gwendolyn Grant evaluated patient and agrees with plan. Patient / Family / Caregiver informed of clinical course, understand medical decision-making process, and agree with plan.   Mora Bellman, PA-C 05/12/14 1058

## 2014-05-10 NOTE — Discharge Instructions (Signed)
Today we do not have a good reason why you are bruising more easily. Your blood work looks very good. Please return to the emergency department if you develop headache, abdominal pain, vomiting, dark stools, fatigue, or any symptoms concerning to you. It is important that you follow up with a doctor for additional testing as an outpatient.   Contusion A contusion is a deep bruise. Contusions happen when an injury causes bleeding under the skin. Signs of bruising include pain, puffiness (swelling), and discolored skin. The contusion may turn blue, purple, or yellow. HOME CARE   Put ice on the injured area.  Put ice in a plastic bag.  Place a towel between your skin and the bag.  Leave the ice on for 15-20 minutes, 03-04 times a day.  Only take medicine as told by your doctor.  Rest the injured area.  If possible, raise (elevate) the injured area to lessen puffiness. GET HELP RIGHT AWAY IF:   You have more bruising or puffiness.  You have pain that is getting worse.  Your puffiness or pain is not helped by medicine. MAKE SURE YOU:   Understand these instructions.  Will watch your condition.  Will get help right away if you are not doing well or get worse. Document Released: 02/18/2008 Document Revised: 11/24/2011 Document Reviewed: 07/07/2011 [facility] Patient Information 2015 [place], [place]. This information is not intended to replace advice given to you by your health care provider. Make sure you discuss any questions you have with your health care provider.

## 2014-05-12 NOTE — ED Provider Notes (Signed)
Medical screening examination/treatment/procedure(s) were conducted as a shared visit with non-physician practitioner(s) and myself.  I personally evaluated the patient during the encounter.   EKG Interpretation None      Patient here with multiple bruises, each at different stages. No trauma. Labs ok. L flank ecchymosis mild, multiple colors, appears old. No abdominal pain, no fevers or vomiting. Doubt Grey-Turners sign. Labs ok. Stable for discharge.  Elwin Mocha, MD 05/12/14 2255

## 2014-05-30 ENCOUNTER — Ambulatory Visit: Payer: Medicaid Other | Attending: Family Medicine | Admitting: Family Medicine

## 2014-05-30 ENCOUNTER — Encounter: Payer: Self-pay | Admitting: Family Medicine

## 2014-05-30 VITALS — BP 143/83 | HR 66 | Temp 98.3°F | Resp 16 | Ht 62.0 in | Wt 133.4 lb

## 2014-05-30 DIAGNOSIS — R7309 Other abnormal glucose: Secondary | ICD-10-CM

## 2014-05-30 DIAGNOSIS — Z7982 Long term (current) use of aspirin: Secondary | ICD-10-CM | POA: Diagnosis not present

## 2014-05-30 DIAGNOSIS — I10 Essential (primary) hypertension: Secondary | ICD-10-CM

## 2014-05-30 DIAGNOSIS — E119 Type 2 diabetes mellitus without complications: Secondary | ICD-10-CM | POA: Diagnosis not present

## 2014-05-30 DIAGNOSIS — E1149 Type 2 diabetes mellitus with other diabetic neurological complication: Secondary | ICD-10-CM

## 2014-05-30 DIAGNOSIS — Z23 Encounter for immunization: Secondary | ICD-10-CM | POA: Diagnosis not present

## 2014-05-30 DIAGNOSIS — L219 Seborrheic dermatitis, unspecified: Secondary | ICD-10-CM | POA: Diagnosis not present

## 2014-05-30 DIAGNOSIS — E1142 Type 2 diabetes mellitus with diabetic polyneuropathy: Secondary | ICD-10-CM

## 2014-05-30 DIAGNOSIS — R944 Abnormal results of kidney function studies: Secondary | ICD-10-CM | POA: Insufficient documentation

## 2014-05-30 DIAGNOSIS — R7989 Other specified abnormal findings of blood chemistry: Secondary | ICD-10-CM

## 2014-05-30 DIAGNOSIS — R799 Abnormal finding of blood chemistry, unspecified: Secondary | ICD-10-CM

## 2014-05-30 DIAGNOSIS — R739 Hyperglycemia, unspecified: Secondary | ICD-10-CM

## 2014-05-30 DIAGNOSIS — I2510 Atherosclerotic heart disease of native coronary artery without angina pectoris: Secondary | ICD-10-CM | POA: Insufficient documentation

## 2014-05-30 LAB — BASIC METABOLIC PANEL
BUN: 32 mg/dL — AB (ref 6–23)
CHLORIDE: 102 meq/L (ref 96–112)
CO2: 26 mEq/L (ref 19–32)
Calcium: 10.3 mg/dL (ref 8.4–10.5)
Creat: 1.38 mg/dL — ABNORMAL HIGH (ref 0.50–1.35)
Glucose, Bld: 104 mg/dL — ABNORMAL HIGH (ref 70–99)
POTASSIUM: 5.3 meq/L (ref 3.5–5.3)
Sodium: 139 mEq/L (ref 135–145)

## 2014-05-30 LAB — HEMOCCULT GUIAC POC 1CARD (OFFICE): FECAL OCCULT BLD: NEGATIVE

## 2014-05-30 LAB — LIPID PANEL
CHOL/HDL RATIO: 2.8 ratio
CHOLESTEROL: 129 mg/dL (ref 0–200)
HDL: 46 mg/dL (ref >39)
LDL Cholesterol: 56 mg/dL (ref 0–99)
Triglycerides: 133 mg/dL (ref <150)
VLDL: 27 mg/dL (ref 0–40)

## 2014-05-30 LAB — POCT GLYCOSYLATED HEMOGLOBIN (HGB A1C): HEMOGLOBIN A1C: 6.1

## 2014-05-30 LAB — POCT CBG (FASTING - GLUCOSE)-MANUAL ENTRY: GLUCOSE FASTING, POC: 106 mg/dL — AB (ref 70–99)

## 2014-05-30 MED ORDER — SPIRONOLACTONE 25 MG PO TABS: 25.0000 mg | ORAL_TABLET | Freq: Every day | ORAL | Status: DC

## 2014-05-30 MED ORDER — METFORMIN HCL ER 500 MG PO TB24: 500.0000 mg | ORAL_TABLET | Freq: Every day | ORAL | Status: DC

## 2014-05-30 MED ORDER — TICAGRELOR 90 MG PO TABS
90.0000 mg | ORAL_TABLET | Freq: Two times a day (BID) | ORAL | Status: DC
Start: 2014-05-30 — End: 2015-06-21

## 2014-05-30 MED ORDER — ATORVASTATIN CALCIUM 80 MG PO TABS: 80.0000 mg | ORAL_TABLET | Freq: Every day | ORAL | Status: DC

## 2014-05-30 MED ORDER — KETOCONAZOLE 2 % EX CREA: 1.0000 "application " | TOPICAL_CREAM | Freq: Every day | CUTANEOUS | Status: DC

## 2014-05-30 MED ORDER — LOSARTAN POTASSIUM-HCTZ 100-12.5 MG PO TABS: 1.0000 | ORAL_TABLET | Freq: Every day | ORAL | Status: DC

## 2014-05-30 MED ORDER — METOPROLOL TARTRATE 50 MG PO TABS: 50.0000 mg | ORAL_TABLET | Freq: Two times a day (BID) | ORAL | Status: DC

## 2014-05-30 NOTE — Assessment & Plan Note (Signed)
A: seborrhea on exam P: nizoral cream prescribed

## 2014-05-30 NOTE — Assessment & Plan Note (Signed)
A: DM2 well controled P:  Continue current regimen Checked lipids

## 2014-05-30 NOTE — Progress Notes (Signed)
   Subjective:    Patient ID: Dustin Vang, male    DOB: 1951-02-01, 63 y.o.   MRN: [id number] CC: establish care, physical   HPI 63 year old feet Angelique Blonder male presents with his daughter to establish care and for a health maintenance physical:  1. Care/concerns: Itchy rash on face.   Past medical history: Reviewed completely. Patient has diabetes. Hemoglobin A1c and blood sugar checked today. Social history: Chronic nonsmoker  Review of Systems  General:  Negative for unexplained weight loss, fever Skin: Negative for new or changing mole, sore that won't heal. Positive for skin rash in ears, face, eye brows that is itchy.  HEENT: Negative for trouble hearing, trouble seeing, ringing in ears, mouth sores, hoarseness, change in voice, dysphagia. CV:  Negative for chest pain, dyspnea, edema, palpitations Resp: Negative for cough, dyspnea, hemoptysis GI: Negative for nausea, vomiting, diarrhea, constipation, abdominal pain, melena, hematochezia. GU: Negative for dysuria, incontinence, urinary hesitance, hematuria, vaginal or penile discharge, polyuria, sexual difficulty, lumps in testicle or breasts MSK: Negative for muscle cramps or aches, joint pain or swelling Neuro: Negative for headaches, weakness, numbness, dizziness, passing out/fainting Psych: Negative for depression, anxiety, memory problems     Objective:   Physical Exam BP 143/83  Pulse 66  Temp(Src) 98.3 F (36.8 C) (Oral)  Resp 16  Ht  (1.575 m)  Wt 133 lb 6.4 oz (60.51 kg)  BMI 24.39 kg/m2  SpO2 99%  General Appearance:    Alert, cooperative, no distress, appears stated age  Head:    Normocephalic, without obvious abnormality, atraumatic  Eyes:    PERRL, conjunctiva/corneas clear, EOM's intact, fundi    benign, both eyes       Ears:    Normal TM's and external ear canals, both ears  Nose:   Nares normal, septum midline, mucosa normal, no drainage   or sinus tenderness  Throat:   Lips, mucosa, and tongue  normal; teeth and gums normal  Neck:   Supple, symmetrical, trachea midline, no adenopathy;       thyroid:  No enlargement/tenderness/nodules;     Back:     Symmetric, no curvature, ROM normal, no CVA tenderness  Lungs:     Clear to auscultation bilaterally, respirations unlabored  Chest wall:    No tenderness or deformity  Heart:    Regular rate and rhythm, S1 and S2 normal, no murmur, rub   or gallop  Abdomen:     Soft, non-tender, bowel sounds active all four quadrants,    no masses, no organomegaly  Genitalia:    Normal male without lesion, discharge or tenderness  Rectal:    Normal tone, normal prostate, no masses or tenderness;   guaiac negative stool  Extremities:   Extremities normal, atraumatic, no cyanosis or edema  Pulses:   2+ and symmetric all extremities  Skin:   Skin color, texture, turgor normal. hyperpigemented maculues on R LL where he had shingles. Flaky white rash in ears, eyebrows, perioral area.   Lymph nodes:   Cervical, supraclavicular, and axillary nodes normal  Neurologic:   CNII-XII intact. Normal strength. Sensation and reflexes      Diminished in RLL.       Assessment & Plan:

## 2014-05-30 NOTE — Assessment & Plan Note (Signed)
A: elevated compared to last year P: Repeat BMP today  If rising plan for f/u UA and possible renal U/S as well as D/C Ace inhibitor

## 2014-05-30 NOTE — Progress Notes (Signed)
Establish care Annual Physical Complaining of bruises, unsure source. Bruises come and go, worse in hip area

## 2014-05-30 NOTE — Patient Instructions (Signed)
Dustin Vang,  Thank you for coming in today. It was a pleasure meeting you. I look forward to be a primary doctor.  You are on an appropriate medication regimen.  I have sent in ketoconazole cream for you face rash, use daily until rash clears which may take up to 4 weeks.   Your diabetes is very well controlled and you are on appropriate medication therapy.  I have sent in med refills.   Plan to f/u in 6 months for diabetes f/u, sooner if needed.   Dr. Armen Pickup

## 2014-05-30 NOTE — Assessment & Plan Note (Signed)
A: BP above goal. Patient did not take BP med today Med: compliant. Normol K+, no rise in Cr from last year  P: Continue current regimen Check BMP

## 2014-06-02 ENCOUNTER — Telehealth: Payer: Self-pay | Admitting: *Deleted

## 2014-06-02 NOTE — Telephone Encounter (Signed)
Message copied by Dyann Kief on Fri Jun 02, 2014  5:49 PM ------      Message from: Dessa Phi      Created: Fri Jun 02, 2014 11:05 AM       Normal lipids      Improved Cr, continue current regimen ------

## 2014-06-02 NOTE — Telephone Encounter (Signed)
Left voice message to return call 

## 2014-08-24 ENCOUNTER — Encounter (HOSPITAL_COMMUNITY): Payer: Self-pay | Admitting: Cardiology

## 2014-11-22 ENCOUNTER — Encounter: Payer: Self-pay | Admitting: Family Medicine

## 2014-11-22 ENCOUNTER — Ambulatory Visit: Payer: Medicaid Other | Attending: Family Medicine | Admitting: Family Medicine

## 2014-11-22 VITALS — BP 165/96 | HR 65 | Temp 98.2°F | Resp 16 | Ht 62.0 in | Wt 146.0 lb

## 2014-11-22 DIAGNOSIS — E1142 Type 2 diabetes mellitus with diabetic polyneuropathy: Secondary | ICD-10-CM

## 2014-11-22 DIAGNOSIS — I5022 Chronic systolic (congestive) heart failure: Secondary | ICD-10-CM

## 2014-11-22 DIAGNOSIS — E119 Type 2 diabetes mellitus without complications: Secondary | ICD-10-CM | POA: Insufficient documentation

## 2014-11-22 DIAGNOSIS — N529 Male erectile dysfunction, unspecified: Secondary | ICD-10-CM

## 2014-11-22 DIAGNOSIS — Z23 Encounter for immunization: Secondary | ICD-10-CM

## 2014-11-22 DIAGNOSIS — I10 Essential (primary) hypertension: Secondary | ICD-10-CM

## 2014-11-22 DIAGNOSIS — Z0000 Encounter for general adult medical examination without abnormal findings: Secondary | ICD-10-CM

## 2014-11-22 DIAGNOSIS — M25561 Pain in right knee: Secondary | ICD-10-CM

## 2014-11-22 LAB — COMPLETE METABOLIC PANEL WITH GFR
ALBUMIN: 4.2 g/dL (ref 3.5–5.2)
ALT: 29 U/L (ref 0–53)
AST: 22 U/L (ref 0–37)
Alkaline Phosphatase: 70 U/L (ref 39–117)
BUN: 29 mg/dL — AB (ref 6–23)
CHLORIDE: 105 meq/L (ref 96–112)
CO2: 19 meq/L (ref 19–32)
Calcium: 9.7 mg/dL (ref 8.4–10.5)
Creat: 1.32 mg/dL (ref 0.50–1.35)
GFR, Est African American: 66 mL/min
GFR, Est Non African American: 57 mL/min — ABNORMAL LOW
GLUCOSE: 93 mg/dL (ref 70–99)
POTASSIUM: 5 meq/L (ref 3.5–5.3)
Sodium: 140 mEq/L (ref 135–145)
TOTAL PROTEIN: 7.7 g/dL (ref 6.0–8.3)
Total Bilirubin: 0.6 mg/dL (ref 0.2–1.2)

## 2014-11-22 LAB — CBC
HEMATOCRIT: 38.1 % — AB (ref 39.0–52.0)
HEMOGLOBIN: 12.7 g/dL — AB (ref 13.0–17.0)
MCH: 30.5 pg (ref 26.0–34.0)
MCHC: 33.3 g/dL (ref 30.0–36.0)
MCV: 91.4 fL (ref 78.0–100.0)
MPV: 8.7 fL (ref 8.6–12.4)
Platelets: 307 10*3/uL (ref 150–400)
RBC: 4.17 MIL/uL — ABNORMAL LOW (ref 4.22–5.81)
RDW: 14 % (ref 11.5–15.5)
WBC: 10.4 10*3/uL (ref 4.0–10.5)

## 2014-11-22 LAB — POCT GLYCOSYLATED HEMOGLOBIN (HGB A1C): HEMOGLOBIN A1C: 6.3

## 2014-11-22 LAB — GLUCOSE, POCT (MANUAL RESULT ENTRY): POC Glucose: 97 mg/dl (ref 70–99)

## 2014-11-22 MED ORDER — ZOSTER VACCINE LIVE 19400 UNT/0.65ML ~~LOC~~ SOLR: 0.6500 mL | Freq: Once | SUBCUTANEOUS | Status: DC

## 2014-11-22 NOTE — Assessment & Plan Note (Signed)
Diabetes: well controlled. Continue metformin 500 mg once daily Referral to opthalmology

## 2014-11-22 NOTE — Progress Notes (Signed)
F/U DM   

## 2014-11-22 NOTE — Assessment & Plan Note (Signed)
Health maintenance: Tdap give today [facility] [address]  Referral to GI for screening colonoscopy

## 2014-11-22 NOTE — Assessment & Plan Note (Addendum)
Erectile dysfunction:  Cardiovascular disease and use of aldactone are common causes Checking Testerone level today Viagra and Cialis are treatment options if your cardiologist agrees.

## 2014-11-22 NOTE — Assessment & Plan Note (Signed)
R knee pain: since fall during heart attack.  X-ray of R knee ordered

## 2014-11-22 NOTE — Assessment & Plan Note (Signed)
HTN: continue current regimen. Will need BP recheck in 4 weeks.

## 2014-11-22 NOTE — Progress Notes (Signed)
   Subjective:    Patient ID: Dustin Vang, male    DOB: 06/13/1951, 64 y.o.   MRN: [id number] CC: new patient, DM HPI [place] interpreter available  1. CHRONIC DIABETES  Disease Monitoring  Blood Sugar Ranges: fasting in 90s   Polyuria: no   Visual problems: no   Medication Compliance: yes  Medication Side Effects  Hypoglycemia: no   Preventitive Health Care  Eye Exam: due   Foot Exam: done today   Diet pattern: eats regular meals    2. Erectile dysfunction: trouble getting a hard erection. Has strong desire for sex. Has stopped aldactone 5 days ago. None of his other medications have ED as side effect. This has been a long standing problem.   3. HTN: saw his cardiologist 2 days ago. stopped cozaar and aldactone. Added lisinopril 20 mg BID. No cough, CP, SOB, edema. Has CAD and chronic systolic CHF.  4. R knee pain: pain in R knee following fall during MI 2 yrs ago (8/14).  Has a healed abrasion. No pain currently.   Soc hx: non smoker  Review of Systems As per HPI     Objective:   Physical Exam BP 165/96 mmHg  Pulse 65  Temp(Src) 98.2 F (36.8 C) (Oral)  Resp 16  Ht 5\' 2"  (1.575 m)  Wt 146 lb (66.225 kg)  BMI 26.70 kg/m2  SpO2 98%  Wt Readings from Last 3 Encounters:  11/22/14 146 lb (66.225 kg)  05/30/14 133 lb 6.4 oz (60.51 kg)  05/19/13 142 lb 11.2 oz (64.728 kg)   BP Readings from Last 3 Encounters:  11/22/14 160/90  05/30/14 143/83  05/10/14 126/81  General appearance: alert, cooperative and no distress Eyes: conjunctivae/corneas clear. PERRL, EOM's intact. Fundi benign. Neck: no adenopathy, supple, symmetrical, trachea midline and thyroid not enlarged, symmetric, no tenderness/mass/nodules Lungs: clear to auscultation bilaterally Heart: regular rate and rhythm, S1, S2 normal, no murmur, click, rub or gallop Extremities: extremities normal, atraumatic, no cyanosis or edema  R knee: scar anterior superior knee. No tender. No joint redness or  swelling.   Lab Results  Component Value Date   HGBA1C 6.30 11/22/2014   CBG 97      Assessment & Plan:

## 2014-11-22 NOTE — Patient Instructions (Addendum)
Mr. Danella Pentonruong,  Thank you for coming in today. It was a pleasure meeting you. I look forward to being your primary doctor.   1. Diabetes: well controlled. Continue metformin 500 mg once daily Referral to opthalmology  2. HTN: continue current regimen. Will need BP recheck in 4 weeks.   3. R knee pain: since fall during heart attack.  X-ray of R knee ordered  4. Erectile dysfunction:  Cardiovascular disease and use of aldactone are common causes Checking Testerone level today Viagra and Cialis are treatment options if your cardiologist agrees.  5. Health maintenance: Tdap give today [facility] [address].  Referral to GI for screening colonoscopy   You will be called with lab and imaging results  F/u in 4 weeks for BP check with nurse  F/u in 3 months for diabetes   Dr. Armen PickupFunches

## 2014-11-23 LAB — TESTOSTERONE, FREE, TOTAL, SHBG
Sex Hormone Binding: 28 nmol/L (ref 22–77)
Testosterone, Free: 49.8 pg/mL (ref 47.0–244.0)
Testosterone-% Free: 2.1 % (ref 1.6–2.9)
Testosterone: 236 ng/dL — ABNORMAL LOW (ref 300–890)

## 2014-11-23 LAB — MICROALBUMIN / CREATININE URINE RATIO
CREATININE, URINE: 64.5 mg/dL
Microalb Creat Ratio: 114.7 mg/g — ABNORMAL HIGH (ref 0.0–30.0)
Microalb, Ur: 7.4 mg/dL — ABNORMAL HIGH (ref <2.0)

## 2014-11-30 ENCOUNTER — Telehealth: Payer: Self-pay | Admitting: *Deleted

## 2014-11-30 NOTE — Telephone Encounter (Signed)
-----   Message from Doris Cheadleeepak Advani, MD sent at 11/23/2014  1:06 PM EST ----- Currently the patient know Blood work reviewed , noted  urine micro albumin positive, likely secondary to diabetes/hypertension, patient is already on an ACE inhibitor. Also noted low testosterone level, patient to discuss treatment options with his PCP Dr. Armen PickupFunches when she is back in office

## 2014-11-30 NOTE — Telephone Encounter (Signed)
[employer] Interpret[employer] [place] [phone] Unable to contact Pt, wrong number

## 2014-12-02 ENCOUNTER — Emergency Department (HOSPITAL_COMMUNITY): Admit: 2014-12-02 | Payer: Medicaid Other

## 2014-12-02 ENCOUNTER — Emergency Department (HOSPITAL_COMMUNITY): Admission: EM | Admit: 2014-12-02 | Payer: Medicaid Other | Source: Home / Self Care

## 2014-12-02 ENCOUNTER — Encounter (HOSPITAL_COMMUNITY): Payer: Self-pay | Admitting: *Deleted

## 2014-12-02 ENCOUNTER — Ambulatory Visit (HOSPITAL_COMMUNITY)
Admission: RE | Admit: 2014-12-02 | Discharge: 2014-12-02 | Disposition: A | Discharge status: Home / Self Care | Payer: Medicaid Other | Source: Other Acute Inpatient Hospital | Attending: Family Medicine | Admitting: Family Medicine

## 2014-12-02 DIAGNOSIS — M25561 Pain in right knee: Secondary | ICD-10-CM | POA: Diagnosis not present

## 2014-12-02 NOTE — ED Notes (Signed)
Pt here for xray of knee.  Called xray, per Josh they have [facility] 10 days ago.  Pt does not want to see provider.  Trixie DredgeEmily West PA aware and ok with pt going to xray on that order and this ED encounter being dismissed.  Italyhad, Forensic scientistN charge nurse to dismiss this encounter.

## 2014-12-02 NOTE — ED Notes (Signed)
Pt reports right knee pain for years but recently has been more severe. Ambulatory at triage.

## 2014-12-06 ENCOUNTER — Telehealth: Payer: Self-pay | Admitting: *Deleted

## 2014-12-06 NOTE — Telephone Encounter (Signed)
[employer] [place] # [phone] Left voice message to return call

## 2014-12-06 NOTE — Telephone Encounter (Signed)
-----   Message from Dessa PhiJosalyn Funches, MD sent at 12/04/2014  8:43 AM EDT ----- R knee x-ray normal

## 2014-12-22 ENCOUNTER — Ambulatory Visit: Payer: Medicaid Other | Attending: Family Medicine | Admitting: *Deleted

## 2014-12-22 VITALS — BP 156/86 | HR 72 | Temp 98.4°F | Resp 14

## 2014-12-22 DIAGNOSIS — Z0130 Encounter for examination of blood pressure without abnormal findings: Secondary | ICD-10-CM

## 2014-12-22 DIAGNOSIS — Z87891 Personal history of nicotine dependence: Secondary | ICD-10-CM | POA: Insufficient documentation

## 2014-12-22 DIAGNOSIS — I10 Essential (primary) hypertension: Secondary | ICD-10-CM | POA: Diagnosis not present

## 2014-12-22 NOTE — Progress Notes (Signed)
Spoke with patient via Sport and exercise psychologistLanguage Resources Interpreter, [name] Patient presents for BP check Med list reviewed; states taking all meds as directed, however, patient has not taken BP meds this morning as he thought he was supposed to fast Reports saw cardiologist 12/11/14 and lisinopril was increased to 40 mg bid and plavix 75 mg daily added; next appt with cardiologist is 01/17/15 Patient following low sodium diet  Eats fresh food only Patient walking 10 minutes daily. Discussed walking 30 minutes per day for exercise Patient denies blurred vision, chest pain or pressure C/o headache; rates 7/10 at present C/o occasional SHOB when first lying down Patient reports BP at home ranging 148-150/86-90. Instructed to keep BP log and bring to all future visits.  BP 156/86  left arm manually with adult cuff P 72 R  14 T 98.4 oral SPO2  96%  Per PCP: No med changes at this time  Patient to return in 2 weeks for nurse visit for BP check. Patient instructed to take AM doses of BP meds prior to arrival at next BP check  Patient advised to call for med refills at least 7 days before running out so as not to go without.

## 2015-01-03 LAB — HM DIABETES EYE EXAM

## 2015-01-09 ENCOUNTER — Ambulatory Visit: Payer: Medicaid Other | Attending: Family Medicine | Admitting: *Deleted

## 2015-01-09 VITALS — BP 158/100 | HR 76 | Temp 97.9°F | Resp 16 | Wt 144.4 lb

## 2015-01-09 DIAGNOSIS — I10 Essential (primary) hypertension: Secondary | ICD-10-CM | POA: Diagnosis not present

## 2015-01-09 NOTE — Progress Notes (Signed)
Spoke with patient via Sport and exercise psychologistLanguage Resources Interpreter, H weir Su Patient presents for BP check Med list reviewed; states taking all meds as directed except not taking plavix States medicaid would not pay for brilinta so was placed on plavix. Now medicaid is paying for brilinta Has appt with cardiologist on 01/12/15 and will discuss with him which med he wants pt on Patient following low sodium diet. Walking 30 minutes per day for exercise Patient denies headaches, blurred vision, SHOB, chest pain  Denies edema Patient brought home BP log States he does not think lisinopril is working. States he takes it at 0600 and checks BP at noon and BP remains the same  Filed Vitals:   01/09/15 1510  BP: 158/100  Pulse: 76  Temp: 97.9 F (36.6 C)  Resp: 16   Wt 144.6 lb  Last wt 146 lb on 11/22/14  Per PCP: Stay the course  F/u with cardiologist on 01/12/15 as scheduled  Patient advised to call for med refills at least 7 days before running out so as not to go without.  Patient aware that he is to f/u with PCP 3 months from last visit (Due 02/22/15)

## 2015-02-05 ENCOUNTER — Encounter: Payer: Self-pay | Admitting: Family Medicine

## 2015-06-05 ENCOUNTER — Encounter (HOSPITAL_COMMUNITY): Payer: Self-pay

## 2015-06-05 ENCOUNTER — Inpatient Hospital Stay (HOSPITAL_COMMUNITY)
Admission: EM | Admit: 2015-06-05 | Discharge: 2015-06-08 | DRG: 287 | Disposition: A | Discharge status: Home / Self Care | Payer: Medicaid Other | Attending: Cardiovascular Disease | Admitting: Cardiovascular Disease

## 2015-06-05 ENCOUNTER — Emergency Department (HOSPITAL_COMMUNITY): Payer: Medicaid Other

## 2015-06-05 DIAGNOSIS — Z955 Presence of coronary angioplasty implant and graft: Secondary | ICD-10-CM

## 2015-06-05 DIAGNOSIS — I340 Nonrheumatic mitral (valve) insufficiency: Secondary | ICD-10-CM | POA: Diagnosis present

## 2015-06-05 DIAGNOSIS — I10 Essential (primary) hypertension: Secondary | ICD-10-CM | POA: Diagnosis present

## 2015-06-05 DIAGNOSIS — Z23 Encounter for immunization: Secondary | ICD-10-CM | POA: Diagnosis not present

## 2015-06-05 DIAGNOSIS — I25119 Atherosclerotic heart disease of native coronary artery with unspecified angina pectoris: Principal | ICD-10-CM | POA: Diagnosis present

## 2015-06-05 DIAGNOSIS — R079 Chest pain, unspecified: Secondary | ICD-10-CM

## 2015-06-05 DIAGNOSIS — Z8249 Family history of ischemic heart disease and other diseases of the circulatory system: Secondary | ICD-10-CM

## 2015-06-05 DIAGNOSIS — Z87891 Personal history of nicotine dependence: Secondary | ICD-10-CM

## 2015-06-05 DIAGNOSIS — Z833 Family history of diabetes mellitus: Secondary | ICD-10-CM

## 2015-06-05 DIAGNOSIS — Z79899 Other long term (current) drug therapy: Secondary | ICD-10-CM | POA: Diagnosis not present

## 2015-06-05 DIAGNOSIS — I249 Acute ischemic heart disease, unspecified: Secondary | ICD-10-CM | POA: Diagnosis present

## 2015-06-05 DIAGNOSIS — Z888 Allergy status to other drugs, medicaments and biological substances status: Secondary | ICD-10-CM | POA: Diagnosis not present

## 2015-06-05 DIAGNOSIS — Z7982 Long term (current) use of aspirin: Secondary | ICD-10-CM

## 2015-06-05 DIAGNOSIS — E119 Type 2 diabetes mellitus without complications: Secondary | ICD-10-CM | POA: Diagnosis present

## 2015-06-05 DIAGNOSIS — I252 Old myocardial infarction: Secondary | ICD-10-CM | POA: Diagnosis not present

## 2015-06-05 LAB — CBC
HEMATOCRIT: 40.2 % (ref 39.0–52.0)
HEMOGLOBIN: 13.4 g/dL (ref 13.0–17.0)
MCH: 30.3 pg (ref 26.0–34.0)
MCHC: 33.3 g/dL (ref 30.0–36.0)
MCV: 91 fL (ref 78.0–100.0)
Platelets: 251 10*3/uL (ref 150–400)
RBC: 4.42 MIL/uL (ref 4.22–5.81)
RDW: 12.9 % (ref 11.5–15.5)
WBC: 6.7 10*3/uL (ref 4.0–10.5)

## 2015-06-05 LAB — BASIC METABOLIC PANEL
ANION GAP: 9 (ref 5–15)
BUN: 23 mg/dL — ABNORMAL HIGH (ref 6–20)
CO2: 22 mmol/L (ref 22–32)
Calcium: 9.3 mg/dL (ref 8.9–10.3)
Chloride: 109 mmol/L (ref 101–111)
Creatinine, Ser: 1.17 mg/dL (ref 0.61–1.24)
GFR calc non Af Amer: >60 mL/min (ref >60)
GLUCOSE: 116 mg/dL — AB (ref 65–99)
POTASSIUM: 4 mmol/L (ref 3.5–5.1)
Sodium: 140 mmol/L (ref 135–145)

## 2015-06-05 LAB — I-STAT TROPONIN, ED
Troponin i, poc: 0 ng/mL (ref 0.00–0.08)
Troponin i, poc: 0 ng/mL (ref 0.00–0.08)

## 2015-06-05 MED ORDER — HYDROCHLOROTHIAZIDE 12.5 MG PO CAPS
12.5000 mg | ORAL_CAPSULE | Freq: Every day | ORAL | Status: DC
  Administered 2015-06-06 – 2015-06-08 (×3): 12.5 mg via ORAL
  Filled 2015-06-05 (×3): qty 1

## 2015-06-05 MED ORDER — HEPARIN (PORCINE) IN NACL 100-0.45 UNIT/ML-% IJ SOLN
1000.0000 [IU]/h | INTRAMUSCULAR | Status: DC
  Administered 2015-06-05: 800 [IU]/h via INTRAVENOUS
  Administered 2015-06-07: 850 [IU]/h via INTRAVENOUS
  Filled 2015-06-05 (×2): qty 250

## 2015-06-05 MED ORDER — NITROGLYCERIN 0.4 MG SL SUBL: 0.4000 mg | SUBLINGUAL_TABLET | SUBLINGUAL | Status: DC | PRN

## 2015-06-05 MED ORDER — ASPIRIN EC 81 MG PO TBEC
81.0000 mg | DELAYED_RELEASE_TABLET | Freq: Every day | ORAL | Status: DC
  Administered 2015-06-06 – 2015-06-08 (×3): 81 mg via ORAL
  Filled 2015-06-05 (×3): qty 1

## 2015-06-05 MED ORDER — TICAGRELOR 90 MG PO TABS
90.0000 mg | ORAL_TABLET | Freq: Two times a day (BID) | ORAL | Status: DC
  Administered 2015-06-05 – 2015-06-08 (×6): 90 mg via ORAL
  Filled 2015-06-05 (×6): qty 1

## 2015-06-05 MED ORDER — SODIUM CHLORIDE 0.9 % IJ SOLN: 3.0000 mL | INTRAMUSCULAR | Status: DC | PRN

## 2015-06-05 MED ORDER — LOSARTAN POTASSIUM 50 MG PO TABS
100.0000 mg | ORAL_TABLET | Freq: Every day | ORAL | Status: DC
  Administered 2015-06-06 – 2015-06-08 (×3): 100 mg via ORAL
  Filled 2015-06-05 (×3): qty 2

## 2015-06-05 MED ORDER — LOSARTAN POTASSIUM-HCTZ 100-12.5 MG PO TABS: 1.0000 | ORAL_TABLET | Freq: Every day | ORAL | Status: DC

## 2015-06-05 MED ORDER — ACETAMINOPHEN 325 MG PO TABS: 650.0000 mg | ORAL_TABLET | ORAL | Status: DC | PRN

## 2015-06-05 MED ORDER — AMLODIPINE BESYLATE 5 MG PO TABS
5.0000 mg | ORAL_TABLET | Freq: Every day | ORAL | Status: DC
  Administered 2015-06-06 – 2015-06-08 (×3): 5 mg via ORAL
  Filled 2015-06-05 (×3): qty 1

## 2015-06-05 MED ORDER — ATORVASTATIN CALCIUM 80 MG PO TABS
80.0000 mg | ORAL_TABLET | Freq: Every day | ORAL | Status: DC
  Administered 2015-06-05 – 2015-06-07 (×3): 80 mg via ORAL
  Filled 2015-06-05 (×4): qty 1

## 2015-06-05 MED ORDER — METOPROLOL TARTRATE 50 MG PO TABS
50.0000 mg | ORAL_TABLET | Freq: Two times a day (BID) | ORAL | Status: DC
  Administered 2015-06-05 – 2015-06-08 (×5): 50 mg via ORAL
  Filled 2015-06-05 (×6): qty 1

## 2015-06-05 MED ORDER — HEPARIN BOLUS VIA INFUSION
3500.0000 [IU] | Freq: Once | INTRAVENOUS | Status: AC
  Administered 2015-06-05: 3500 [IU] via INTRAVENOUS
  Filled 2015-06-05: qty 3500

## 2015-06-05 MED ORDER — SODIUM CHLORIDE 0.9 % IV SOLN
250.0000 mL | INTRAVENOUS | Status: DC | PRN
  Administered 2015-06-07: 250 mL via INTRAVENOUS

## 2015-06-05 MED ORDER — SODIUM CHLORIDE 0.9 % IJ SOLN
3.0000 mL | Freq: Two times a day (BID) | INTRAMUSCULAR | Status: DC
  Administered 2015-06-05 – 2015-06-07 (×2): 3 mL via INTRAVENOUS

## 2015-06-05 MED ORDER — ONDANSETRON HCL 4 MG/2ML IJ SOLN
4.0000 mg | Freq: Four times a day (QID) | INTRAMUSCULAR | Status: DC | PRN
Start: 2015-06-05 — End: 2015-06-08

## 2015-06-05 NOTE — ED Notes (Signed)
Pt here with daughter, onset yesterday intermittant eft chest pain radiating to bilateral shoulders and shortness of breath.  Squeezing pain comes every 3-5 min.  Pt took NTG x 2 @ 2:15 p with no relief.

## 2015-06-05 NOTE — Progress Notes (Signed)
Report received. Pt arrived to unit with no complaints of pain. VSS. Pt oriented to room. Resting comfortably. Will continue to monitor closely.  Sandrea Hammond RN

## 2015-06-05 NOTE — H&P (Signed)
Referring Physician: Charolette Forward, MD  Dustin Vang is an 64 y.o. male.                       Chief Complaint: Chest pain  HPI: 64 year old male with PMH of Inferior-posterior MI, s/p drug eluting stent in RCA in 04/2013 has squeezing type, intermittent, retrosternal chest pain radiating to both shoulders. No fever or cough. Some shortness of breath. No nausea, vomting or dizziness. Mild relief with one SL NTG use.  Past Medical History  Diagnosis Date  . Myocardial infarction 05/09/2013  . Anginal pain   . Hypertension 2000  . Diabetes mellitus without complication aug 2014      Past Surgical History  Procedure Laterality Date  . No past surgeries    . Heart    . Coronary stent placement  05/11/2013   . Left heart catheterization with coronary angiogram Bilateral 05/09/2013    Procedure: LEFT HEART CATHETERIZATION WITH CORONARY ANGIOGRAM;  Surgeon: Clent Demark, MD;  Location: [facility] CATH LAB;  Service: Cardiovascular;  Laterality: Bilateral;  . Left heart catheterization with coronary angiogram N/A 05/11/2013    Procedure: LEFT HEART CATHETERIZATION WITH CORONARY ANGIOGRAM;  Surgeon: Clent Demark, MD;  Location: [facility] CATH LAB;  Service: Cardiovascular;  Laterality: N/A;    Family History  Problem Relation Age of Onset  . Diabetes Mother   . Hypertension Mother   . Cancer Neg Hx    Social History:  reports that he quit smoking about 16 years ago. His smoking use included Cigarettes. He has a 15 pack-year smoking history. He has never used smokeless tobacco. He reports that he does not drink alcohol or use illicit drugs.  Allergies:  Allergies  Allergen Reactions  . Precedex [Dexmedetomidine Hcl In Nacl]     Bradycardia/arrest     (Not in a hospital admission)  Results for orders placed or performed during the hospital encounter of 06/05/15 (from the past 48 hour(s))  Basic metabolic panel     Status: Abnormal   Collection Time: 06/05/15  3:06 PM  Result Value Ref Range    Sodium 140 135 - 145 mmol/L   Potassium 4.0 3.5 - 5.1 mmol/L   Chloride 109 101 - 111 mmol/L   CO2 22 22 - 32 mmol/L   Glucose, Bld 116 (H) 65 - 99 mg/dL   BUN 23 (H) 6 - 20 mg/dL   Creatinine, Ser 1.17 0.61 - 1.24 mg/dL   Calcium 9.3 8.9 - 10.3 mg/dL   GFR calc non Af Amer >60 >60 mL/min   GFR calc Af Amer >60 >60 mL/min    Comment: (NOTE) The eGFR has been calculated using the CKD EPI equation. This calculation has not been validated in all clinical situations. eGFR's persistently <60 mL/min signify possible Chronic Kidney Disease.    Anion gap 9 5 - 15  CBC     Status: None   Collection Time: 06/05/15  3:06 PM  Result Value Ref Range   WBC 6.7 4.0 - 10.5 K/uL   RBC 4.42 4.22 - 5.81 MIL/uL   Hemoglobin 13.4 13.0 - 17.0 g/dL   HCT 40.2 39.0 - 52.0 %   MCV 91.0 78.0 - 100.0 fL   MCH 30.3 26.0 - 34.0 pg   MCHC 33.3 30.0 - 36.0 g/dL   RDW 12.9 11.5 - 15.5 %   Platelets 251 150 - 400 K/uL  I-stat troponin, ED     Status: None  Collection Time: 06/05/15  3:11 PM  Result Value Ref Range   Troponin i, poc 0.00 0.00 - 0.08 ng/mL   Comment 3            Comment: Due to the release kinetics of cTnI, a negative result within the first hours of the onset of symptoms does not rule out myocardial infarction with certainty. If myocardial infarction is still suspected, repeat the test at appropriate intervals.   I-stat troponin, ED     Status: None   Collection Time: 06/05/15  8:15 PM  Result Value Ref Range   Troponin i, poc 0.00 0.00 - 0.08 ng/mL   Comment 3            Comment: Due to the release kinetics of cTnI, a negative result within the first hours of the onset of symptoms does not rule out myocardial infarction with certainty. If myocardial infarction is still suspected, repeat the test at appropriate intervals.    Dg Chest 2 View  06/05/2015   CLINICAL DATA:  Chest pain that extends into shoulders since yesterday - hx of Mi 2 years ago, stent - nonsmoker, htn, no  lung issues known  EXAM: CHEST - 2 VIEW  COMPARISON:  None available  FINDINGS: Small dense probably calcified left lower lobe granuloma. Lungs are otherwise clear. Heart size and mediastinal contours are within normal limits. No effusion. Visualized skeletal structures are unremarkable.  IMPRESSION: No acute cardiopulmonary disease.   Electronically Signed   By: Lucrezia Europe M.D.   On: 06/05/2015 15:37    Review Of Systems Constitutional: Negative for fever, chills and fatigue.  Eyes: Negative for visual disturbance.  Respiratory: Positive for shortness of breath.  Cardiovascular: Positive for chest pain.  Gastrointestinal: Negative for nausea, vomiting, abdominal pain, diarrhea and constipation.  Genitourinary: Negative for dysuria, urgency and frequency.  Musculoskeletal: Positive for myalgias and arthralgias. Negative for back pain, neck pain and neck stiffness.   Neurological: Negative for dizziness, syncope, weakness, light-headedness, numbness and headaches.  Blood pressure 125/76, pulse 60, temperature 98.1 F (36.7 C), resp. rate 16, height $RemoveBe'5\' 1"'[id number]$  (1.549 m), weight 64.501 kg (142 lb 3.2 oz), SpO2 97 %.  Physical Exam  Constitutional: He appears well-developed and well-nourished. No distress.  HENT: Normocephalic and atraumatic. Right external ear normal. Left external ear normal. Nose: Nose normal. Uvula is midline, oropharynx is clear and moist and mucous membranes are normal.  Eyes: Brown, Conj-Pink, Sclera-white. Wears glasses. Pupils are equal, round, and reactive to light.  Neck: Normal range of motion. Neck supple.  Cardiovascular: Normal rate, regular rhythm, normal heart sounds, intact distal pulses.  Pulmonary/Chest: Effort normal and breath sounds normal. No respiratory distress.   Abdominal: Soft and bowel sounds are normal. There is no tenderness. There is no rigidity, no rebound and no guarding.  Musculoskeletal: Normal range of motion. He exhibits no edema or  tenderness.  Neurological: He is alert and oriented to person, place, and time. Moves all 4 extremities. Skin: Skin is warm, dry. No rash noted. He is not diaphoretic. No erythema. No pallor.  Psychiatric: He has a normal mood and affect. His speech is normal and behavior is normal.  Nursing note and vitals reviewed.  Assessment/Plan Chest pain r/o MI CAD S/P RCA angioplasty Hypertension DM, II  Admit/Troponin-I/Home medications C. Cath or nuclear stress test.  Birdie Riddle, MD  06/05/2015, 10:06 PM

## 2015-06-05 NOTE — ED Provider Notes (Signed)
CSN: [id number]     Arrival date & time 06/05/15  1446 History   First MD Initiated Contact with Patient 06/05/15 1921     Chief Complaint  Patient presents with  . Chest Pain     HPI   Dustin Vang is a 64 y.o. 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 He reports his symptoms started last night and have been intermittent since that time. He characterizes his chest pain as "squeezing." He denies exacerbating factors. He has tried nitroglycerin, with little symptom relief. He reports his chest pain radiates to his shoulders bilaterally. He states he experienced shoulder pain with his last MI. He denies fever, chills, HA, lightheadedness, dizziness, syncope, abdominal pain, N/V/D/C, dysuria, urgency, frequency.    Past Medical History  Diagnosis Date  . Myocardial infarction 05/09/2013  . Anginal pain   . Hypertension 2000  . Diabetes mellitus without complication aug 2014   Past Surgical History  Procedure Laterality Date  . No past surgeries    . Heart    . Coronary stent placement  05/11/2013   . Left heart catheterization with coronary angiogram Bilateral 05/09/2013    Procedure: LEFT HEART CATHETERIZATION WITH CORONARY ANGIOGRAM;  Surgeon: Robynn Pane, MD;  Location: [facility] CATH LAB;  Service: Cardiovascular;  Laterality: Bilateral;  . Left heart catheterization with coronary angiogram N/A 05/11/2013    Procedure: LEFT HEART CATHETERIZATION WITH CORONARY ANGIOGRAM;  Surgeon: Robynn Pane, MD;  Location: [facility] CATH [facility];  Service: Cardiovascular;  Laterality: N/A;   Family History  Problem Relation Age of Onset  . Diabetes Mother   . Hypertension Mother   . Cancer Neg Hx    Social History  Substance Use Topics  . Smoking status: Former Smoker -- 0.50 packs/day for 30 years    Types: Cigarettes    Quit date: 09/15/1998  . Smokeless tobacco: Never Used  . Alcohol Use: No      Review of Systems  Constitutional: Negative for fever, chills and fatigue.  Eyes: Negative for visual disturbance.  Respiratory: Positive for shortness of breath.   Cardiovascular: Positive for chest pain.  Gastrointestinal: Negative for nausea, vomiting, abdominal pain, diarrhea and constipation.  Genitourinary: Negative for dysuria, urgency and frequency.  Musculoskeletal: Positive for myalgias and arthralgias. Negative for back pain, neck pain and neck stiffness.       Reports bilateral shoulder pain.  Neurological: Negative for dizziness, syncope, weakness, light-headedness, numbness and headaches.  All other systems reviewed and are negative.     Allergies  Precedex  Home Medications   Prior to Admission medications   Medication Sig Start Date End Date Taking? Authorizing Eshaal Duby  acetaminophen (TYLENOL) 500 MG tablet Take 500 mg by mouth daily as needed for pain.    Historical Davonne Baby, MD  aspirin EC 81 MG EC tablet Take 1 tablet (81 mg total) by mouth daily. 05/19/13   Rinaldo Cloud, MD  atorvastatin (LIPITOR) 80 MG tablet Take 1 tablet (80 mg total) by mouth daily at 6 PM. 05/30/14   Dessa Phi, MD  clopidogrel (PLAVIX) 75 MG tablet Take 75 mg by mouth daily.    Historical Dejon Lukas, MD  ketoconazole (NIZORAL) 2 % cream Apply 1 application topically daily. Apply to dry, flaky area of face Patient not taking: Reported on 01/09/2015 05/30/14   Dessa Phi, MD  lisinopril (PRINIVIL,ZESTRIL) 40 MG tablet Take 40 mg  by mouth 2 (two) times daily.    Historical Cheron Pasquarelli, MD  metFORMIN (GLUCOPHAGE-XR) 500 MG 24 hr tablet Take 1 tablet (500 mg total) by mouth daily with breakfast. 05/30/14   Dessa Phi, MD  metoprolol (LOPRESSOR) 50 MG tablet Take 1 tablet (50 mg total) by mouth 2 (two) times daily. 05/30/14   Dessa Phi, MD  ticagrelor (BRILINTA) 90 MG TABS tablet Take 1 tablet (90 mg total) by mouth 2 (two) times daily. 05/30/14   Josalyn Funches, MD  zoster  vaccine live, PF, (ZOSTAVAX) 65784 UNT/0.65ML injection Inject 19,400 Units into the skin once. Patient not taking: Reported on 01/09/2015 11/22/14   Josalyn Funches, MD    BP 125/76 mmHg  Pulse 60  Temp(Src) 98.1 F (36.7 C)  Resp 16  Ht  (1.549 m)  Wt 142 lb 3.2 oz (64.501 kg)  BMI 26.88 kg/m2  SpO2 97% Physical Exam  Constitutional: He is oriented to person, place, and time. He appears well-developed and well-nourished. No distress.  HENT:  Head: Normocephalic and atraumatic.  Right Ear: External ear normal.  Left Ear: External ear normal.  Nose: Nose normal.  Mouth/Throat: Uvula is midline, oropharynx is clear and moist and mucous membranes are normal.  Eyes: Conjunctivae, EOM and lids are normal. Pupils are equal, round, and reactive to light. Right eye exhibits no discharge. Left eye exhibits no discharge. No scleral icterus.  Neck: Normal range of motion. Neck supple.  Cardiovascular: Normal rate, regular rhythm, normal heart sounds, intact distal pulses and normal pulses.   Pulmonary/Chest: Effort normal and breath sounds normal. No respiratory distress. He has no wheezes. He has no rales. He exhibits no tenderness.  Abdominal: Soft. Normal appearance and bowel sounds are normal. He exhibits no distension and no mass. There is no tenderness. There is no rigidity, no rebound and no guarding.  Musculoskeletal: Normal range of motion. He exhibits no edema or tenderness.  Neurological: He is alert and oriented to person, place, and time.  Skin: Skin is warm, dry and intact. No rash noted. He is not diaphoretic. No erythema. No pallor.  Psychiatric: He has a normal mood and affect. His speech is normal and behavior is normal. Judgment and thought content normal.  Nursing note and vitals reviewed.   ED Course  Procedures (including critical care time)  Labs Review Labs Reviewed  BASIC METABOLIC PANEL - Abnormal; Notable for the following:    Glucose, Bld 116 (*)    BUN 23  (*)    All other components within normal limits  CBC  I-STAT TROPOININ, ED  I-STAT TROPOININ, ED    Imaging Review Dg Chest 2 View  06/05/2015   CLINICAL DATA:  Chest pain that extends into shoulders since yesterday - hx of Mi 2 years ago, stent - nonsmoker, htn, no lung issues known  EXAM: CHEST - 2 VIEW  COMPARISON:  None available  FINDINGS: Small dense probably calcified left lower lobe granuloma. Lungs are otherwise clear. Heart size and mediastinal contours are within normal limits. No effusion. Visualized skeletal structures are unremarkable.  IMPRESSION: No acute cardiopulmonary disease.   Electronically Signed   By: Corlis Leak M.D.   On: 06/05/2015 15:37   I have personally reviewed and evaluated these images and lab results as part of my medical decision-making.   EKG Interpretation   Date/Time:  Tuesday June 05 2015 19:24:54 EDT Ventricular Rate:  60 PR Interval:  165 QRS Duration: 108 QT Interval:  459 QTC Calculation: 459 R  Axis:   -20 Text Interpretation:  Sinus rhythm Inferoposterior infarct, age  indeterminate Anterolateral infarct, age indeterminate no significant  change from earlier in the day Confirmed by GOLDSTON  MD, SCOTT ([id number]) on  06/05/2015 7:47:04 PM      MDM   Final diagnoses:  Chest pain, unspecified chest pain type   64 year old male presents with chest pain, which radiates to his bilateral shoulders, and shortness of breath. Symptoms started yesterday and have been intermittent since that time. He reports his symptoms feel similar to how he felt when he had his MI in 2014. Denies fever, chills, HA, lightheadedness, dizziness, syncope, abdominal pain, N/V/D/C, dysuria, urgency, frequency.  Patient is afebrile. Vital signs stable. Heart regular rate and rhythm. Lungs clear to auscultation bilaterally. Abdomen soft, nontender, nondistended. No lower extremity edema.  EKG no acute ischemia. Troponin negative x 2. CBC, BMP unremarkable. Chest  x-ray with no acute cardiopulmonary disease. Cardiology consulted given patient's past medical history and presentation similar to prior MI. Spoke with Dr. Algie Coffer, who will see the patient in the ED. Patient to be admitted.  BP 125/76 mmHg  Pulse 60  Temp(Src) 98.1 F (36.7 C)  Resp 16  Ht  (1.549 m)  Wt 142 lb 3.2 oz (64.501 kg)  BMI 26.88 kg/m2  SpO2 97%    Mady Gemma, PA-C 06/06/15 0149  Pricilla Loveless, MD 06/08/15 [phone]

## 2015-06-05 NOTE — Progress Notes (Signed)
ANTICOAGULATION CONSULT NOTE - Initial Consult  Pharmacy Consult for heparin Indication: ACS / STEMI  Allergies  Allergen Reactions  . Precedex [Dexmedetomidine Hcl In Nacl]     Bradycardia/arrest    Patient Measurements: Height:  (154.9 cm) Weight: 142 lb 3.2 oz (64.501 kg) IBW/kg (Calculated) : 52.3  Vital Signs: Temp: 98.1 F (36.7 C) (09/20 1505) BP: 125/76 mmHg (09/20 1505) Pulse Rate: 60 (09/20 1505)  Labs:  Recent Labs  06/05/15 1506  HGB 13.4  HCT 40.2  PLT 251  CREATININE 1.17    Estimated Creatinine Clearance: 52.3 mL/min (by C-G formula based on Cr of 1.17).   Medical History: Past Medical History  Diagnosis Date  . Myocardial infarction 05/09/2013  . Anginal pain   . Hypertension 2000  . Diabetes mellitus without complication aug 2014    Assessment: 64 yo M presents on 9/20 with intermittent chest pain and radiates to bilateral shoulders. Pharmacy consulted to dose heparin. CBC stable, no s/s of bleed. No anticoagulant at home but is on Brilinta and aspirin.  Goal of Therapy:  Heparin level 0.3-0.7 units/ml Monitor platelets by anticoagulation protocol: Yes   Plan:  Give 3,500 units heparin BOLUS Start heparin gtt at 800 units/hr Check 6hr HL Monitor daily HL, CBC, s/s of bleed   BATCHELDER,NATHAN J 06/05/2015,10:07 PM

## 2015-06-06 ENCOUNTER — Inpatient Hospital Stay (HOSPITAL_COMMUNITY): Payer: Medicaid Other

## 2015-06-06 ENCOUNTER — Ambulatory Visit (HOSPITAL_COMMUNITY): Payer: Medicaid Other

## 2015-06-06 LAB — BASIC METABOLIC PANEL
ANION GAP: 8 (ref 5–15)
BUN: 17 mg/dL (ref 6–20)
CHLORIDE: 107 mmol/L (ref 101–111)
CO2: 26 mmol/L (ref 22–32)
Calcium: 9.5 mg/dL (ref 8.9–10.3)
Creatinine, Ser: 1.14 mg/dL (ref 0.61–1.24)
GFR calc Af Amer: >60 mL/min (ref >60)
GLUCOSE: 113 mg/dL — AB (ref 65–99)
POTASSIUM: 3.6 mmol/L (ref 3.5–5.1)
Sodium: 141 mmol/L (ref 135–145)

## 2015-06-06 LAB — LIPID PANEL
CHOL/HDL RATIO: 2.8 ratio
Cholesterol: 138 mg/dL (ref 0–200)
HDL: 49 mg/dL (ref >40)
LDL Cholesterol: 72 mg/dL (ref 0–99)
Triglycerides: 84 mg/dL (ref <150)
VLDL: 17 mg/dL (ref 0–40)

## 2015-06-06 LAB — PROTIME-INR
INR: 1.07 (ref 0.00–1.49)
PROTHROMBIN TIME: 14.1 s (ref 11.6–15.2)

## 2015-06-06 LAB — NM MYOCAR MULTI W/SPECT W/WALL MOTION / EF
CHL CUP NUCLEAR SDS: 3
CHL CUP NUCLEAR SRS: 18
CHL CUP NUCLEAR SSS: 21
LV dias vol: 121 mL
LV sys vol: 70 mL
NUC STRESS TID: 0.96
RATE: 0

## 2015-06-06 LAB — GLUCOSE, CAPILLARY
GLUCOSE-CAPILLARY: 187 mg/dL — AB (ref 65–99)
Glucose-Capillary: 167 mg/dL — ABNORMAL HIGH (ref 65–99)
Glucose-Capillary: 170 mg/dL — ABNORMAL HIGH (ref 65–99)

## 2015-06-06 LAB — TROPONIN I: Troponin I: <0.03 ng/mL (ref <0.031)

## 2015-06-06 LAB — HEPARIN LEVEL (UNFRACTIONATED): HEPARIN UNFRACTIONATED: 0.33 [IU]/mL (ref 0.30–0.70)

## 2015-06-06 MED ORDER — REGADENOSON 0.4 MG/5ML IV SOLN
0.4000 mg | Freq: Once | INTRAVENOUS | Status: AC
  Administered 2015-06-06: 0.4 mg via INTRAVENOUS

## 2015-06-06 MED ORDER — REGADENOSON 0.4 MG/5ML IV SOLN
INTRAVENOUS | Status: AC
  Filled 2015-06-06: qty 5

## 2015-06-06 MED ORDER — TECHNETIUM TC 99M SESTAMIBI GENERIC - CARDIOLITE
10.0000 | Freq: Once | INTRAVENOUS | Status: AC | PRN
  Administered 2015-06-06: 10 via INTRAVENOUS

## 2015-06-06 MED ORDER — GLIPIZIDE ER 5 MG PO TB24
5.0000 mg | ORAL_TABLET | Freq: Every day | ORAL | Status: DC
  Administered 2015-06-08: 5 mg via ORAL
  Filled 2015-06-06 (×4): qty 1

## 2015-06-06 MED ORDER — TECHNETIUM TC 99M SESTAMIBI GENERIC - CARDIOLITE
30.0000 | Freq: Once | INTRAVENOUS | Status: AC | PRN
  Administered 2015-06-06: 30 via INTRAVENOUS

## 2015-06-06 MED ORDER — INFLUENZA VAC SPLIT QUAD 0.5 ML IM SUSY
0.5000 mL | PREFILLED_SYRINGE | INTRAMUSCULAR | Status: AC
  Administered 2015-06-08: 0.5 mL via INTRAMUSCULAR
  Filled 2015-06-06: qty 0.5

## 2015-06-06 NOTE — Progress Notes (Signed)
Ref: Lora Paula, MD   Subjective:  Underwent nuclear stress test showing anterolateral infarct/ischemia and ultrasound showing inferioe wall hypokinesia.  Objective:  Vital Signs in the last 24 hours: Temp:  [97.6 F (36.4 C)-98.1 F (36.7 C)] 97.9 F (36.6 C) (09/21 1500) Pulse Rate:  [52-91] 64 (09/21 1500) Cardiac Rhythm:  [-] Normal sinus rhythm (09/21 1900) Resp:  [13-24] 18 (09/21 1500) BP: (121-179)/(72-120) 159/92 mmHg (09/21 1500) SpO2:  [96 %-100 %] 100 % (09/21 1500) Weight:  [63.821 kg (140 lb 11.2 oz)] 63.821 kg (140 lb 11.2 oz) (09/20 2244)  Physical Exam: BP Readings from Last 1 Encounters:  06/06/15 159/92    Wt Readings from Last 1 Encounters:  06/05/15 63.821 kg (140 lb 11.2 oz)    Weight change:   HEENT: [place]/AT, Eyes-Brown, PERL, EOMI, Conjunctiva-Pink, Sclera-Non-icteric Neck: No JVD, No bruit, Trachea midline. Lungs:  Clear, Bilateral. Cardiac:  Regular rhythm, normal S1 and S2, no S3. II/VI systolic murmur. Abdomen:  Soft, non-tender. Extremities:  No edema present. No cyanosis. No clubbing. CNS: AxOx3, Cranial nerves grossly intact, moves all 4 extremities. Right handed. Skin: Warm and dry.   Intake/Output from previous day:      Lab Results: BMET    Component Value Date/Time   NA 141 06/06/2015 0334   NA 140 06/05/2015 1506   NA 140 11/22/2014 1143   K 3.6 06/06/2015 0334   K 4.0 06/05/2015 1506   K 5.0 11/22/2014 1143   CL 107 06/06/2015 0334   CL 109 06/05/2015 1506   CL 105 11/22/2014 1143   CO2 26 06/06/2015 0334   CO2 22 06/05/2015 1506   CO2 19 11/22/2014 1143   GLUCOSE 113* 06/06/2015 0334   GLUCOSE 116* 06/05/2015 1506   GLUCOSE 93 11/22/2014 1143   BUN 17 06/06/2015 0334   BUN 23* 06/05/2015 1506   BUN 29* 11/22/2014 1143   CREATININE 1.14 06/06/2015 0334   CREATININE 1.17 06/05/2015 1506   CREATININE 1.32 11/22/2014 1143   CREATININE 1.38* 05/30/2014 1030   CREATININE 1.55* 05/10/2014 1820   CALCIUM 9.5  06/06/2015 0334   CALCIUM 9.3 06/05/2015 1506   CALCIUM 9.7 11/22/2014 1143   CALCIUM 10.3 03/20/2010 0223   GFRNONAA >60 06/06/2015 0334   GFRNONAA >60 06/05/2015 1506   GFRNONAA 57* 11/22/2014 1143   GFRNONAA 46* 05/10/2014 1820   GFRAA >60 06/06/2015 0334   GFRAA >60 06/05/2015 1506   GFRAA 66 11/22/2014 1143   GFRAA 54* 05/10/2014 1820   CBC    Component Value Date/Time   WBC 6.7 06/05/2015 1506   RBC 4.42 06/05/2015 1506   HGB 13.4 06/05/2015 1506   HCT 40.2 06/05/2015 1506   PLT 251 06/05/2015 1506   MCV 91.0 06/05/2015 1506   MCH 30.3 06/05/2015 1506   MCHC 33.3 06/05/2015 1506   RDW 12.9 06/05/2015 1506   LYMPHSABS 2.0 05/10/2014 1820   MONOABS 0.7 05/10/2014 1820   EOSABS 0.8* 05/10/2014 1820   BASOSABS 0.0 05/10/2014 1820   HEPATIC Function Panel  Recent Labs  11/22/14 1143  PROT 7.7   HEMOGLOBIN A1C No components found for: HGA1C,  MPG CARDIAC ENZYMES Lab Results  Component Value Date   CKTOTAL 1532* 05/11/2013   CKMB 96.6* 05/11/2013   TROPONINI <0.03 06/06/2015   TROPONINI <0.03 06/06/2015   TROPONINI <0.03 06/05/2015   BNP No results for input(s): PROBNP in the last 8760 hours. TSH No results for input(s): TSH in the last 8760 hours. CHOLESTEROL  Recent Labs  06/06/15 0334  CHOL 138    Scheduled Meds: . amLODipine  5 mg Oral Daily  . aspirin EC  81 mg Oral Daily  . atorvastatin  80 mg Oral q1800  . glipiZIDE  5 mg Oral Q breakfast  . losartan  100 mg Oral Daily   And  . hydrochlorothiazide  12.5 mg Oral Daily  . [START ON 06/07/2015] Influenza vac split quadrivalent PF  0.5 mL Intramuscular Tomorrow-1000  . metoprolol  50 mg Oral BID  . regadenoson      . sodium chloride  3 mL Intravenous Q12H  . ticagrelor  90 mg Oral BID   Continuous Infusions: . heparin 850 Units/hr (06/06/15 1052)   PRN Meds:.sodium chloride, acetaminophen, nitroGLYCERIN, ondansetron (ZOFRAN) IV, sodium chloride  Assessment/Plan: Chest pain r/o  MI CAD S/P RCA angioplasty Hypertension DM, II   Patient and son are agreeable to cardiac cath tomorrow. Nurse to get interpreter in AM.   LOS: 1 day    Orpah Cobb  MD  06/06/2015, 8:11 PM

## 2015-06-06 NOTE — Progress Notes (Signed)
  Echocardiogram 2D Echocardiogram has been performed.  Cathie Beams 06/06/2015, 12:04 PM

## 2015-06-06 NOTE — Progress Notes (Signed)
ANTICOAGULATION CONSULT NOTE - Follow Up Consult  Pharmacy Consult for heparin Indication: ACS / STEMI  Allergies  Allergen Reactions  . Precedex [Dexmedetomidine Hcl In Nacl]     Bradycardia/arrest    Patient Measurements: Height:  (154.9 cm) Weight: 140 lb 11.2 oz (63.821 kg) IBW/kg (Calculated) : 52.3  Vital Signs: Temp: 98.1 F (36.7 C) (09/21 0451) Temp Source: Oral (09/21 0451) BP: 121/75 mmHg (09/21 0451) Pulse Rate: 59 (09/21 0451)  Labs:  Recent Labs  06/05/15 1506 06/05/15 2332 06/06/15 0334 06/06/15 0640  HGB 13.4  --   --   --   HCT 40.2  --   --   --   PLT 251  --   --   --   HEPARINUNFRC  --   --   --  0.33  CREATININE 1.17  --  1.14  --   TROPONINI  --  <0.03 <0.03  --     Estimated Creatinine Clearance: 53.4 mL/min (by C-G formula based on Cr of 1.14).   Medical History: Past Medical History  Diagnosis Date  . Myocardial infarction 05/09/2013  . Anginal pain   . Hypertension 2000  . Diabetes mellitus without complication aug 2014    Assessment: 64 yo M presents on 9/20 with intermittent chest pain and radiates to bilateral shoulders. Currently on IV heparin at 800 units/hr. HL this AM is low therapeutic at 0.33. CBC stable, no s/s of bleed. RN reports no s/s of bleeding.  Goal of Therapy:  Heparin level 0.3-0.7 units/ml Monitor platelets by anticoagulation protocol: Yes   Plan:  Increase heparin infusion at 850 units/hr  F/u after stress today planned for noon  Monitor daily HL, CBC, s/s of bleed   Vinnie Level, PharmD., BCPS Clinical Pharmacist Pager [phone]

## 2015-06-07 ENCOUNTER — Encounter (HOSPITAL_COMMUNITY)
Admission: EM | Disposition: A | Discharge status: Home / Self Care | Payer: Self-pay | Source: Home / Self Care | Attending: Cardiovascular Disease

## 2015-06-07 HISTORY — PX: CARDIAC CATHETERIZATION: SHX172

## 2015-06-07 LAB — CBC
HEMATOCRIT: 41 % (ref 39.0–52.0)
Hemoglobin: 13.7 g/dL (ref 13.0–17.0)
MCH: 30.4 pg (ref 26.0–34.0)
MCHC: 33.4 g/dL (ref 30.0–36.0)
MCV: 90.9 fL (ref 78.0–100.0)
PLATELETS: 274 10*3/uL (ref 150–400)
RBC: 4.51 MIL/uL (ref 4.22–5.81)
RDW: 13 % (ref 11.5–15.5)
WBC: 7.8 10*3/uL (ref 4.0–10.5)

## 2015-06-07 LAB — GLUCOSE, CAPILLARY
Glucose-Capillary: 117 mg/dL — ABNORMAL HIGH (ref 65–99)
Glucose-Capillary: 103 mg/dL — ABNORMAL HIGH (ref 65–99)
Glucose-Capillary: 156 mg/dL — ABNORMAL HIGH (ref 65–99)
Glucose-Capillary: 217 mg/dL — ABNORMAL HIGH (ref 65–99)

## 2015-06-07 LAB — HEPARIN LEVEL (UNFRACTIONATED): Heparin Unfractionated: 0.23 IU/mL — ABNORMAL LOW (ref 0.30–0.70)

## 2015-06-07 SURGERY — LEFT HEART CATH AND CORONARY ANGIOGRAPHY

## 2015-06-07 MED ORDER — SODIUM CHLORIDE 0.9 % IJ SOLN: 3.0000 mL | Freq: Two times a day (BID) | INTRAMUSCULAR | Status: DC

## 2015-06-07 MED ORDER — FENTANYL CITRATE (PF) 100 MCG/2ML IJ SOLN
INTRAMUSCULAR | Status: DC | PRN
  Administered 2015-06-07 (×2): 25 ug via INTRAVENOUS

## 2015-06-07 MED ORDER — FENTANYL CITRATE (PF) 100 MCG/2ML IJ SOLN
INTRAMUSCULAR | Status: AC
  Filled 2015-06-07: qty 4

## 2015-06-07 MED ORDER — HEPARIN (PORCINE) IN NACL 2-0.9 UNIT/ML-% IJ SOLN
INTRAMUSCULAR | Status: DC | PRN
  Administered 2015-06-07: 14:00:00

## 2015-06-07 MED ORDER — SODIUM CHLORIDE 0.9 % IV SOLN: 250.0000 mL | INTRAVENOUS | Status: DC | PRN

## 2015-06-07 MED ORDER — HEPARIN (PORCINE) IN NACL 2-0.9 UNIT/ML-% IJ SOLN
INTRAMUSCULAR | Status: AC
  Filled 2015-06-07: qty 1500

## 2015-06-07 MED ORDER — SODIUM CHLORIDE 0.9 % IV SOLN
INTRAVENOUS | Status: DC | PRN
  Administered 2015-06-07: 999 mL/h via INTRAVENOUS

## 2015-06-07 MED ORDER — SODIUM CHLORIDE 0.9 % IV SOLN
INTRAVENOUS | Status: DC
  Administered 2015-06-07: 15:00:00 via INTRAVENOUS

## 2015-06-07 MED ORDER — SODIUM CHLORIDE 0.9 % IJ SOLN: 3.0000 mL | INTRAMUSCULAR | Status: DC | PRN

## 2015-06-07 MED ORDER — SODIUM CHLORIDE 0.9 % IJ SOLN
3.0000 mL | Freq: Two times a day (BID) | INTRAMUSCULAR | Status: DC
  Administered 2015-06-07: 3 mL via INTRAVENOUS

## 2015-06-07 MED ORDER — SODIUM CHLORIDE 0.9 % IV SOLN: INTRAVENOUS | Status: DC

## 2015-06-07 MED ORDER — LIDOCAINE HCL (PF) 1 % IJ SOLN
INTRAMUSCULAR | Status: AC
Start: 2015-06-07 — End: 2015-06-07
  Filled 2015-06-07: qty 30

## 2015-06-07 MED ORDER — INSULIN ASPART 100 UNIT/ML ~~LOC~~ SOLN
3.0000 [IU] | Freq: Once | SUBCUTANEOUS | Status: AC
  Administered 2015-06-07: 3 [IU] via SUBCUTANEOUS

## 2015-06-07 MED ORDER — MIDAZOLAM HCL 2 MG/2ML IJ SOLN
INTRAMUSCULAR | Status: DC | PRN
  Administered 2015-06-07 (×2): 1 mg via INTRAVENOUS

## 2015-06-07 MED ORDER — MIDAZOLAM HCL 2 MG/2ML IJ SOLN
INTRAMUSCULAR | Status: AC
  Filled 2015-06-07: qty 4

## 2015-06-07 MED ORDER — INSULIN ASPART 100 UNIT/ML ~~LOC~~ SOLN
0.0000 [IU] | Freq: Three times a day (TID) | SUBCUTANEOUS | Status: DC
  Administered 2015-06-08: 2 [IU] via SUBCUTANEOUS

## 2015-06-07 SURGICAL SUPPLY — 7 items
CATH INFINITI 5FR MULTPACK ANG (CATHETERS) ×3 IMPLANT
KIT HEART LEFT (KITS) ×3 IMPLANT
PACK CARDIAC CATHETERIZATION (CUSTOM PROCEDURE TRAY) ×3 IMPLANT
SHEATH PINNACLE 5F 10CM (SHEATH) ×3 IMPLANT
SYR MEDRAD MARK V 150ML (SYRINGE) ×3 IMPLANT
TRANSDUCER W/STOPCOCK (MISCELLANEOUS) ×3 IMPLANT
WIRE EMERALD 3MM-J .035X150CM (WIRE) ×3 IMPLANT

## 2015-06-07 NOTE — H&P (View-Only) (Signed)
Ref: FUNCHES, JOSALYN C, MD   Subjective:  Underwent nuclear stress test showing anterolateral infarct/ischemia and ultrasound showing inferioe wall hypokinesia.  Objective:  Vital Signs in the last 24 hours: Temp:  [97.6 F (36.4 C)-98.1 F (36.7 C)] 97.9 F (36.6 C) (09/21 1500) Pulse Rate:  [52-91] 64 (09/21 1500) Cardiac Rhythm:  [-] Normal sinus rhythm (09/21 1900) Resp:  [13-24] 18 (09/21 1500) BP: (121-179)/(72-120) 159/92 mmHg (09/21 1500) SpO2:  [96 %-100 %] 100 % (09/21 1500) Weight:  [63.821 kg (140 lb 11.2 oz)] 63.821 kg (140 lb 11.2 oz) (09/20 2244)  Physical Exam: BP Readings from Last 1 Encounters:  06/06/15 159/92    Wt Readings from Last 1 Encounters:  06/05/15 63.821 kg (140 lb 11.2 oz)    Weight change:   HEENT: [place]/AT, Eyes-Brown, PERL, EOMI, Conjunctiva-Pink, Sclera-Non-icteric Neck: No JVD, No bruit, Trachea midline. Lungs:  Clear, Bilateral. Cardiac:  Regular rhythm, normal S1 and S2, no S3. II/VI systolic murmur. Abdomen:  Soft, non-tender. Extremities:  No edema present. No cyanosis. No clubbing. CNS: AxOx3, Cranial nerves grossly intact, moves all 4 extremities. Right handed. Skin: Warm and dry.   Intake/Output from previous day:      Lab Results: BMET    Component Value Date/Time   NA 141 06/06/2015 0334   NA 140 06/05/2015 1506   NA 140 11/22/2014 1143   K 3.6 06/06/2015 0334   K 4.0 06/05/2015 1506   K 5.0 11/22/2014 1143   CL 107 06/06/2015 0334   CL 109 06/05/2015 1506   CL 105 11/22/2014 1143   CO2 26 06/06/2015 0334   CO2 22 06/05/2015 1506   CO2 19 11/22/2014 1143   GLUCOSE 113* 06/06/2015 0334   GLUCOSE 116* 06/05/2015 1506   GLUCOSE 93 11/22/2014 1143   BUN 17 06/06/2015 0334   BUN 23* 06/05/2015 1506   BUN 29* 11/22/2014 1143   CREATININE 1.14 06/06/2015 0334   CREATININE 1.17 06/05/2015 1506   CREATININE 1.32 11/22/2014 1143   CREATININE 1.38* 05/30/2014 1030   CREATININE 1.55* 05/10/2014 1820   CALCIUM 9.5  06/06/2015 0334   CALCIUM 9.3 06/05/2015 1506   CALCIUM 9.7 11/22/2014 1143   CALCIUM 10.3 03/20/2010 0223   GFRNONAA >60 06/06/2015 0334   GFRNONAA >60 06/05/2015 1506   GFRNONAA 57* 11/22/2014 1143   GFRNONAA 46* 05/10/2014 1820   GFRAA >60 06/06/2015 0334   GFRAA >60 06/05/2015 1506   GFRAA 66 11/22/2014 1143   GFRAA 54* 05/10/2014 1820   CBC    Component Value Date/Time   WBC 6.7 06/05/2015 1506   RBC 4.42 06/05/2015 1506   HGB 13.4 06/05/2015 1506   HCT 40.2 06/05/2015 1506   PLT 251 06/05/2015 1506   MCV 91.0 06/05/2015 1506   MCH 30.3 06/05/2015 1506   MCHC 33.3 06/05/2015 1506   RDW 12.9 06/05/2015 1506   LYMPHSABS 2.0 05/10/2014 1820   MONOABS 0.7 05/10/2014 1820   EOSABS 0.8* 05/10/2014 1820   BASOSABS 0.0 05/10/2014 1820   HEPATIC Function Panel  Recent Labs  11/22/14 1143  PROT 7.7   HEMOGLOBIN A1C No components found for: HGA1C,  MPG CARDIAC ENZYMES Lab Results  Component Value Date   CKTOTAL 1532* 05/11/2013   CKMB 96.6* 05/11/2013   TROPONINI <0.03 06/06/2015   TROPONINI <0.03 06/06/2015   TROPONINI <0.03 06/05/2015   BNP No results for input(s): PROBNP in the last 8760 hours. TSH No results for input(s): TSH in the last 8760 hours. CHOLESTEROL  Recent Labs    06/06/15 0334  CHOL 138    Scheduled Meds: . amLODipine  5 mg Oral Daily  . aspirin EC  81 mg Oral Daily  . atorvastatin  80 mg Oral q1800  . glipiZIDE  5 mg Oral Q breakfast  . losartan  100 mg Oral Daily   And  . hydrochlorothiazide  12.5 mg Oral Daily  . [START ON 06/07/2015] Influenza vac split quadrivalent PF  0.5 mL Intramuscular Tomorrow-1000  . metoprolol  50 mg Oral BID  . regadenoson      . sodium chloride  3 mL Intravenous Q12H  . ticagrelor  90 mg Oral BID   Continuous Infusions: . heparin 850 Units/hr (06/06/15 1052)   PRN Meds:.sodium chloride, acetaminophen, nitroGLYCERIN, ondansetron (ZOFRAN) IV, sodium chloride  Assessment/Plan: Chest pain r/o  MI CAD S/P RCA angioplasty Hypertension DM, II   Patient and son are agreeable to cardiac cath tomorrow. Nurse to get interpreter in AM.   LOS: 1 day    Amdrew Oboyle  MD  06/06/2015, 8:11 PM      

## 2015-06-07 NOTE — Progress Notes (Addendum)
Site area: RFA Site Prior to Removal:  Level 0 Pressure Applied For:36min Manual:yes    Patient Status During Pull: stable  Post Pull Site:  Level 0 Post Pull Instructions Given:  done Post Pull Pulses Present: palpable Dressing Applied:clear   Bedrest begins @ 1510 till 1915 Comments:

## 2015-06-07 NOTE — Progress Notes (Signed)
Patient resting comfortable in bed, son present at bedside.  Call light within reach

## 2015-06-07 NOTE — Progress Notes (Signed)
UR Completed. Seena Ritacco, RN, BSN.  [phone] 

## 2015-06-07 NOTE — Interval H&P Note (Signed)
History and Physical Interval Note:  06/07/2015 1:35 PM  Dustin Vang  has presented today for surgery, with the diagnosis of Chest pain  The various methods of treatment have been discussed with the patient and family. After consideration of risks, benefits and other options for treatment, the patient has consented to  Procedure(s): Left Heart Cath and Coronary Angiography (N/A) as a surgical intervention .  The patient's history has been reviewed, patient examined, no change in status, stable for surgery.  I have reviewed the patient's chart and labs.  Questions were answered to the patient's satisfaction.     KADAKIA,AJAY S

## 2015-06-07 NOTE — Progress Notes (Signed)
ANTICOAGULATION CONSULT NOTE - Follow Up Consult  Pharmacy Consult for heparin Indication: ACS / STEMI  Allergies  Allergen Reactions  . Precedex [Dexmedetomidine Hcl In Nacl]     Bradycardia/arrest    Patient Measurements: Height:  (154.9 cm) Weight: 140 lb 11.2 oz (63.821 kg) IBW/kg (Calculated) : 52.3  Vital Signs: Temp: 97.6 F (36.4 C) (09/22 0512) Temp Source: Oral (09/22 0512) BP: 115/79 mmHg (09/22 0512) Pulse Rate: 66 (09/22 0512)  Labs:  Recent Labs  06/05/15 1506 06/05/15 2332 06/06/15 0334 06/06/15 0640 06/06/15 1048 06/07/15 0533  HGB 13.4  --   --   --   --  13.7  HCT 40.2  --   --   --   --  41.0  PLT 251  --   --   --   --  274  LABPROT  --   --   --   --  14.1  --   INR  --   --   --   --  1.07  --   HEPARINUNFRC  --   --   --  0.33  --  0.23*  CREATININE 1.17  --  1.14  --   --   --   TROPONINI  --  <0.03 <0.03  --  <0.03  --     Estimated Creatinine Clearance: 53.4 mL/min (by C-G formula based on Cr of 1.14).   Assessment: 64 yo M presents on 9/20 with intermittent chest pain and radiates to bilateral shoulders. He is s/p nuclear stress test that revealed infarct/ischemia. Heparin level is subtherapeutic at 0.23 on 850 units/hr. CBC stable, no s/s of bleed. RN reports no s/s of bleeding or infusion problems.  Goal of Therapy:  Heparin level 0.3-0.7 units/ml Monitor platelets by anticoagulation protocol: Yes   Plan:  Increase heparin infusion to 10000 units/hr  6 hr heparin level or f/u after cath Monitor daily HL, CBC, s/s of bleed   [facility], [place].D., BCPS Clinical Pharmacist Pager: [phone] 06/07/2015 8:59 AM

## 2015-06-08 ENCOUNTER — Encounter (HOSPITAL_COMMUNITY): Payer: Self-pay | Admitting: Cardiovascular Disease

## 2015-06-08 LAB — CBC
HCT: 40.9 % (ref 39.0–52.0)
Hemoglobin: 13.7 g/dL (ref 13.0–17.0)
MCH: 30.4 pg (ref 26.0–34.0)
MCHC: 33.5 g/dL (ref 30.0–36.0)
MCV: 90.7 fL (ref 78.0–100.0)
PLATELETS: 231 10*3/uL (ref 150–400)
RBC: 4.51 MIL/uL (ref 4.22–5.81)
RDW: 13.1 % (ref 11.5–15.5)
WBC: 7.2 10*3/uL (ref 4.0–10.5)

## 2015-06-08 LAB — POCT ACTIVATED CLOTTING TIME: Activated Clotting Time: 122 seconds

## 2015-06-08 LAB — BASIC METABOLIC PANEL
Anion gap: 6 (ref 5–15)
BUN: 16 mg/dL (ref 6–20)
CALCIUM: 9.1 mg/dL (ref 8.9–10.3)
CO2: 25 mmol/L (ref 22–32)
CREATININE: 1.23 mg/dL (ref 0.61–1.24)
Chloride: 105 mmol/L (ref 101–111)
Glucose, Bld: 107 mg/dL — ABNORMAL HIGH (ref 65–99)
Potassium: 3.8 mmol/L (ref 3.5–5.1)
Sodium: 136 mmol/L (ref 135–145)

## 2015-06-08 LAB — GLUCOSE, CAPILLARY
Glucose-Capillary: 103 mg/dL — ABNORMAL HIGH (ref 65–99)
Glucose-Capillary: 99 mg/dL (ref 65–99)
Glucose-Capillary: 147 mg/dL — ABNORMAL HIGH (ref 65–99)

## 2015-06-08 NOTE — Discharge Summary (Signed)
Physician Discharge Summary  Patient ID: Dustin Vang MRN: [id number] DOB/AGE: [birth year] 62 y.o.  Admit date: 06/05/2015 Discharge date: 06/08/2015  Admission Diagnoses: Chest pain r/o MI CAD S/P RCA angioplasty Hypertension DM, II  Discharge Diagnoses:  Principle Problem: Chest pain at rest CAD S/P RCA angioplasty Hypertension DM, II Moderate Mitral valve regurgitation  Discharged Condition: fair   Hospital Course: 64 year old male with PMH of Inferior-posterior MI, s/p drug eluting stent in RCA in 04/2013 has squeezing type, intermittent, retrosternal chest pain radiating to both shoulders. No fever or cough. Some shortness of breath. No nausea, vomting or dizziness. Mild relief with one SL NTG use. His nuclear stress test showed moderate area of antero-lateral infarction but echocardiogram showed good wall motion in that area. He underwent cardiac catheterization showing : RCA drug-eluting stent was patent. Mid RCA lesion, 30% stenosed. The lesion was not previously treated. Prox LAD to Dist LAD lesion, 20% stenosed. The lesion was not previously treated. 2nd Mrg lesion, 50% stenosed. The lesion was not previously treated. His lipid profile was excellent. He was discharged home with follow up by his doctor in 2 weeks. His Hgb A1C was pending at time of discharge.  Consults: cardiology  Significant Diagnostic Studies: Lab:  EKG: Sinus bradycardia with inferior and posterior infarct and possible lateral infarct  Echocardiogram: Left ventricle: The cavity size was normal. There was mild concentric hypertrophy. Systolic function was moderately reduced. The estimated ejection fraction was in the range of 35% to 40%. There is severe hypokinesis of the mid-apical inferior myocardium; consistent with ischemia. Doppler parameters are consistent with abnormal left ventricular relaxation (grade 1 diastolicdysfunction). - Aortic valve: There was trivial regurgitation. - Mitral valve:  Calcified annulus. There was moderate regurgitation. - Left atrium: The atrium was moderately dilated. - Pulmonary arteries: Systolic pressure was mildly increased.  Cardiac catheterization: RCA drug-eluting stent was patent. Mid RCA lesion, 30% stenosed. The lesion was not previously treated. Prox LAD to Dist LAD lesion, 20% stenosed. The lesion was not previously treated. 2nd Mrg lesion, 50% stenosed. The lesion was not previously treated.  Treatments: cardiac meds: metoprolol, amlodipine, losartan-HCTZ, aspirin and Ticagrelor( Brilinta)  Discharge Exam: Blood pressure 102/52, pulse 64, temperature 98.1 F (36.7 C), temperature source Oral, resp. rate 19, height  (1.549 m), weight 62.732 kg (138 lb 4.8 oz), SpO2 99 %. HEENT: [place]/AT, Eyes-Brown, wears glasses, PERL, EOMI, Conjunctiva-Pink, Sclera-Non-icteric Neck: No JVD, No bruit, Trachea midline. Lungs: Clear, Bilateral. Cardiac: Regular rhythm, normal S1 and S2, no S3. II/VI systolic murmur. Abdomen: Soft, non-tender. Extremities: No edema present. No cyanosis. No clubbing. No right groin hematoma. CNS: AxOx3, Cranial nerves grossly intact, moves all 4 extremities. Right handed. Skin: Warm and dry.  Disposition: 01-Home or Self Care     Medication List    TAKE these medications        amLODipine 5 MG tablet  Commonly known as:  NORVASC  Take 5 mg by mouth daily.     aspirin 81 MG EC tablet  Take 1 tablet (81 mg total) by mouth daily.     atorvastatin 80 MG tablet  Commonly known as:  LIPITOR  Take 1 tablet (80 mg total) by mouth daily at 6 PM.     losartan-hydrochlorothiazide 100-12.5 MG per tablet  Commonly known as:  HYZAAR  Take 1 tablet by mouth daily.     metFORMIN 500 MG tablet  Commonly known as:  GLUCOPHAGE  Take 500 mg by mouth daily.  metoprolol 50 MG tablet  Commonly known as:  LOPRESSOR  Take 1 tablet (50 mg total) by mouth 2 (two) times daily.     nitroGLYCERIN 0.4 MG SL tablet  Commonly  known as:  NITROSTAT  Place 0.4 mg under the tongue every 5 (five) minutes as needed for chest pain.     ticagrelor 90 MG Tabs tablet  Commonly known as:  BRILINTA  Take 1 tablet (90 mg total) by mouth 2 (two) times daily.           Follow-up Information    Follow up with Lora Paula, MD. Schedule an appointment as soon as possible for a visit in 2 weeks.   Specialty:  Family Medicine   Contact information:   [address] [place] [address] [phone]       Signed: Ricki Rodriguez 06/08/2015, 1:54 PM

## 2015-06-08 NOTE — Progress Notes (Signed)
Dc instructions given to daughter over the phone.  Packet sent with patient.  Daughter to sign dc packet upon arrival.  Pt has no s/s of any acute distress or c/o pain at this time.

## 2015-06-09 LAB — HEMOGLOBIN A1C
HEMOGLOBIN A1C: 6.6 % — AB (ref 4.8–5.6)
MEAN PLASMA GLUCOSE: 143 mg/dL

## 2015-06-21 ENCOUNTER — Ambulatory Visit: Payer: Medicaid Other | Attending: Family Medicine | Admitting: Family Medicine

## 2015-06-21 ENCOUNTER — Encounter: Payer: Self-pay | Admitting: Family Medicine

## 2015-06-21 VITALS — BP 130/80 | HR 71 | Temp 98.8°F | Resp 16 | Ht 61.0 in | Wt 140.6 lb

## 2015-06-21 DIAGNOSIS — Z7984 Long term (current) use of oral hypoglycemic drugs: Secondary | ICD-10-CM | POA: Insufficient documentation

## 2015-06-21 DIAGNOSIS — Z5189 Encounter for other specified aftercare: Secondary | ICD-10-CM | POA: Diagnosis present

## 2015-06-21 DIAGNOSIS — E119 Type 2 diabetes mellitus without complications: Secondary | ICD-10-CM | POA: Diagnosis not present

## 2015-06-21 DIAGNOSIS — Z7982 Long term (current) use of aspirin: Secondary | ICD-10-CM | POA: Diagnosis not present

## 2015-06-21 DIAGNOSIS — Z87891 Personal history of nicotine dependence: Secondary | ICD-10-CM | POA: Diagnosis not present

## 2015-06-21 DIAGNOSIS — Z1159 Encounter for screening for other viral diseases: Secondary | ICD-10-CM

## 2015-06-21 DIAGNOSIS — I5022 Chronic systolic (congestive) heart failure: Secondary | ICD-10-CM

## 2015-06-21 DIAGNOSIS — Z955 Presence of coronary angioplasty implant and graft: Secondary | ICD-10-CM | POA: Diagnosis not present

## 2015-06-21 DIAGNOSIS — I2510 Atherosclerotic heart disease of native coronary artery without angina pectoris: Secondary | ICD-10-CM | POA: Diagnosis not present

## 2015-06-21 DIAGNOSIS — I252 Old myocardial infarction: Secondary | ICD-10-CM | POA: Insufficient documentation

## 2015-06-21 DIAGNOSIS — I10 Essential (primary) hypertension: Secondary | ICD-10-CM | POA: Diagnosis not present

## 2015-06-21 DIAGNOSIS — Z0000 Encounter for general adult medical examination without abnormal findings: Secondary | ICD-10-CM

## 2015-06-21 DIAGNOSIS — Z79899 Other long term (current) drug therapy: Secondary | ICD-10-CM | POA: Diagnosis not present

## 2015-06-21 DIAGNOSIS — I129 Hypertensive chronic kidney disease with stage 1 through stage 4 chronic kidney disease, or unspecified chronic kidney disease: Secondary | ICD-10-CM | POA: Diagnosis not present

## 2015-06-21 LAB — BASIC METABOLIC PANEL
BUN: 23 mg/dL (ref 7–25)
CO2: 29 mmol/L (ref 20–31)
Calcium: 9.7 mg/dL (ref 8.6–10.3)
Chloride: 102 mmol/L (ref 98–110)
Creat: 1.08 mg/dL (ref 0.70–1.25)
GLUCOSE: 135 mg/dL — AB (ref 65–99)
POTASSIUM: 4.4 mmol/L (ref 3.5–5.3)
SODIUM: 139 mmol/L (ref 135–146)

## 2015-06-21 LAB — GLUCOSE, POCT (MANUAL RESULT ENTRY): POC GLUCOSE: 95 mg/dL (ref 70–99)

## 2015-06-21 MED ORDER — ASPIRIN 81 MG PO TBEC: 81.0000 mg | DELAYED_RELEASE_TABLET | Freq: Every day | ORAL | Status: AC

## 2015-06-21 MED ORDER — LOSARTAN POTASSIUM-HCTZ 100-12.5 MG PO TABS: 1.0000 | ORAL_TABLET | Freq: Every day | ORAL | Status: DC

## 2015-06-21 MED ORDER — ATORVASTATIN CALCIUM 80 MG PO TABS: 80.0000 mg | ORAL_TABLET | Freq: Every day | ORAL | Status: AC

## 2015-06-21 MED ORDER — METOPROLOL TARTRATE 50 MG PO TABS: 50.0000 mg | ORAL_TABLET | Freq: Two times a day (BID) | ORAL | Status: DC

## 2015-06-21 MED ORDER — TICAGRELOR 90 MG PO TABS: 90.0000 mg | ORAL_TABLET | Freq: Two times a day (BID) | ORAL | Status: DC

## 2015-06-21 MED ORDER — AMLODIPINE BESYLATE 5 MG PO TABS: 5.0000 mg | ORAL_TABLET | Freq: Every day | ORAL | Status: AC

## 2015-06-21 MED ORDER — METFORMIN HCL 500 MG PO TABS: 500.0000 mg | ORAL_TABLET | Freq: Every day | ORAL | Status: DC

## 2015-06-21 NOTE — Assessment & Plan Note (Signed)
A: initial BP was elevated above patient's baseline. Recheck BP improved 130/80.  P: BMP

## 2015-06-21 NOTE — Progress Notes (Signed)
Subjective:  Patient ID: Dustin Vang, male    DOB: 1951/04/23  Age: 64 y.o. MRN: [id number]  CC: Hospitalization Follow-up and Chest Pain   HPI Dustin Vang presents for  1. HFU CP:  Patient was admitted from 9/20-9/23/2016 for chest pain. He has hx of MI. He underwent nuclear stress test, ECHO and cath during his hospitalization. Cardiac catheterization: RCA drug-eluting stent was patent. Mid RCA lesion, 30% stenosed. The lesion was not previously treated. Prox LAD to Dist LAD lesion, 20% stenosed. The lesion was not previously treated. 2nd Mrg lesion, 50% stenosed. The lesion was not previously treated. He was discharged home with medication management for CAD. He denies recurrent CP. He is compliant with all medications. He has not had to take nitroglycerin.    Social History  Substance Use Topics  . Smoking status: Former Smoker -- 0.50 packs/day for 30 years    Types: Cigarettes    Quit date: 09/15/1998  . Smokeless tobacco: Never Used  . Alcohol Use: No   Outpatient Prescriptions Prior to Visit  Medication Sig Dispense Refill  . amLODipine (NORVASC) 5 MG tablet Take 5 mg by mouth daily.  3  . aspirin EC 81 MG EC tablet Take 1 tablet (81 mg total) by mouth daily. 30 tablet 3  . atorvastatin (LIPITOR) 80 MG tablet Take 1 tablet (80 mg total) by mouth daily at 6 PM. 30 tablet 5  . losartan-hydrochlorothiazide (HYZAAR) 100-12.5 MG per tablet Take 1 tablet by mouth daily.  3  . metFORMIN (GLUCOPHAGE) 500 MG tablet Take 500 mg by mouth daily.  1  . metoprolol (LOPRESSOR) 50 MG tablet Take 1 tablet (50 mg total) by mouth 2 (two) times daily. 60 tablet 5  . nitroGLYCERIN (NITROSTAT) 0.4 MG SL tablet Place 0.4 mg under the tongue every 5 (five) minutes as needed for chest pain.    . ticagrelor (BRILINTA) 90 MG TABS tablet Take 1 tablet (90 mg total) by mouth 2 (two) times daily. 60 tablet 5   No facility-administered medications prior to visit.    ROS Review of Systems    Constitutional: Negative for fever, chills, fatigue and unexpected weight change.  Eyes: Negative for visual disturbance.  Respiratory: Negative for cough and shortness of breath.   Cardiovascular: Negative for chest pain, palpitations and leg swelling.  Gastrointestinal: Negative for nausea, vomiting, abdominal pain, diarrhea, constipation and blood in stool.  Endocrine: Negative for polydipsia, polyphagia and polyuria.  Musculoskeletal: Negative for myalgias, back pain, arthralgias, gait problem and neck pain.  Skin: Negative for rash.  Allergic/Immunologic: Negative for immunocompromised state.  Hematological: Negative for adenopathy. Does not bruise/bleed easily.  Psychiatric/Behavioral: Negative for suicidal ideas, sleep disturbance and dysphoric mood. The patient is not nervous/anxious.     Objective:  BP 138/80 mmHg  Pulse 71  Temp(Src) 98.8 F (37.1 C)  Resp 16  Ht  (1.549 m)  Wt 140 lb 9.6 oz (63.776 kg)  BMI 26.58 kg/m2  SpO2 100%  BP/Weight 06/21/2015 06/08/2015 01/09/2015  Systolic BP 164 102 158  Diastolic BP 90 52 100  Wt. (Lbs) 140.6 138.3 144.4  BMI 26.58 26.14 26.4    Physical Exam  Constitutional: He appears well-developed and well-nourished. No distress.  HENT:  Head: Normocephalic and atraumatic.  Neck: Normal range of motion. Neck supple.  Cardiovascular: Normal rate, regular rhythm, normal heart sounds and intact distal pulses.   Pulmonary/Chest: Effort normal and breath sounds normal.  Musculoskeletal: He exhibits no edema.  Neurological:  He is alert.  Skin: Skin is warm and dry. No rash noted. No erythema.  Psychiatric: He has a normal mood and affect.   Lab Results  Component Value Date   HGBA1C 6.6* 06/08/2015   CBG 95  Assessment & Plan:   Problem List Items Addressed This Visit    CAD (coronary artery disease) (Chronic)   Relevant Medications   losartan-hydrochlorothiazide (HYZAAR) 100-12.5 MG tablet   metoprolol (LOPRESSOR) 50 MG  tablet   ticagrelor (BRILINTA) 90 MG TABS tablet   atorvastatin (LIPITOR) 80 MG tablet   aspirin 81 MG EC tablet   amLODipine (NORVASC) 5 MG tablet   DM type 2 (diabetes mellitus, type 2) (HCC) - Primary (Chronic)   Relevant Medications   losartan-hydrochlorothiazide (HYZAAR) 100-12.5 MG tablet   metFORMIN (GLUCOPHAGE) 500 MG tablet   atorvastatin (LIPITOR) 80 MG tablet   aspirin 81 MG EC tablet   Other Relevant Orders   Glucose (CBG) (Completed)   Healthcare maintenance   Relevant Orders   Ambulatory referral to Gastroenterology   Hypertension (Chronic)   Relevant Medications   losartan-hydrochlorothiazide (HYZAAR) 100-12.5 MG tablet   metoprolol (LOPRESSOR) 50 MG tablet   atorvastatin (LIPITOR) 80 MG tablet   aspirin 81 MG EC tablet   amLODipine (NORVASC) 5 MG tablet   Systolic CHF, chronic (HCC) (Chronic)   Relevant Medications   losartan-hydrochlorothiazide (HYZAAR) 100-12.5 MG tablet   metoprolol (LOPRESSOR) 50 MG tablet   atorvastatin (LIPITOR) 80 MG tablet   aspirin 81 MG EC tablet   amLODipine (NORVASC) 5 MG tablet      No orders of the defined types were placed in this encounter.    Follow-up: No Follow-up on file.   Dessa Phi MD

## 2015-06-21 NOTE — Patient Instructions (Addendum)
Dustin Vang was seen today for hospitalization follow-up and chest pain.  Diagnoses and all orders for this visit:  Type 2 diabetes mellitus without complication, without long-term current use of insulin (HCC) -     Glucose (CBG) -     metFORMIN (GLUCOPHAGE) 500 MG tablet; Take 1 tablet (500 mg total) by mouth daily. -     atorvastatin (LIPITOR) 80 MG tablet; Take 1 tablet (80 mg total) by mouth daily at 6 PM.  Essential hypertension -     losartan-hydrochlorothiazide (HYZAAR) 100-12.5 MG tablet; Take 1 tablet by mouth daily. -     metoprolol (LOPRESSOR) 50 MG tablet; Take 1 tablet (50 mg total) by mouth 2 (two) times daily. -     amLODipine (NORVASC) 5 MG tablet; Take 1 tablet (5 mg total) by mouth daily.  Systolic CHF, chronic (HCC) -     metoprolol (LOPRESSOR) 50 MG tablet; Take 1 tablet (50 mg total) by mouth 2 (two) times daily.  Coronary artery disease involving native coronary artery of native heart without angina pectoris -     ticagrelor (BRILINTA) 90 MG TABS tablet; Take 1 tablet (90 mg total) by mouth 2 (two) times daily. -     atorvastatin (LIPITOR) 80 MG tablet; Take 1 tablet (80 mg total) by mouth daily at 6 PM. -     aspirin 81 MG EC tablet; Take 1 tablet (81 mg total) by mouth daily.  Healthcare maintenance -     Ambulatory referral to Gastroenterology  for screening colonoscopy   All medication refilled today Continue current regimen  F/u in 3-4 months for diabetes and HTN  Dr. Heath Lark

## 2015-06-21 NOTE — Progress Notes (Signed)
Patient here for follow up from the hospital Was hospitalized for three days for heart condition Patient will follow up with cardiologist in Dec. Patient had his flu vaccine in the hospital Presents in office with elevated blood pressure and stated he Did take his medication this am

## 2015-06-22 LAB — HEPATITIS C ANTIBODY: HCV AB: NEGATIVE

## 2015-07-04 ENCOUNTER — Telehealth: Payer: Self-pay | Admitting: *Deleted

## 2015-07-04 NOTE — Telephone Encounter (Signed)
Used [employer]preter Vietnamese [phone] LVM to return call

## 2015-07-04 NOTE — Telephone Encounter (Signed)
-----   Message from Dessa PhiJosalyn Funches, MD sent at 06/22/2015  9:08 AM EDT ----- screening hep C negative Normal BMP

## 2016-01-08 ENCOUNTER — Other Ambulatory Visit: Payer: Self-pay | Admitting: Family Medicine

## 2016-02-09 ENCOUNTER — Other Ambulatory Visit: Payer: Self-pay | Admitting: Family Medicine

## 2016-03-13 ENCOUNTER — Other Ambulatory Visit: Payer: Self-pay | Admitting: Family Medicine

## 2016-04-12 ENCOUNTER — Other Ambulatory Visit: Payer: Self-pay | Admitting: Family Medicine

## 2016-04-12 NOTE — Telephone Encounter (Signed)
Rx Request 

## 2016-11-03 ENCOUNTER — Other Ambulatory Visit: Payer: Self-pay | Admitting: Cardiology

## 2016-11-03 DIAGNOSIS — R079 Chest pain, unspecified: Secondary | ICD-10-CM

## 2016-11-21 ENCOUNTER — Ambulatory Visit (HOSPITAL_COMMUNITY)
Admission: RE | Admit: 2016-11-21 | Discharge: 2016-11-21 | Disposition: A | Discharge status: Home / Self Care | Payer: Medicaid Other | Source: Ambulatory Visit | Attending: Cardiology | Admitting: Cardiology

## 2016-11-21 DIAGNOSIS — R079 Chest pain, unspecified: Secondary | ICD-10-CM | POA: Diagnosis not present

## 2016-11-21 MED ORDER — REGADENOSON 0.4 MG/5ML IV SOLN
INTRAVENOUS | Status: AC
  Administered 2016-11-21: 0.4 mg
  Filled 2016-11-21: qty 5

## 2016-11-21 MED ORDER — TECHNETIUM TC 99M TETROFOSMIN IV KIT
10.0000 | PACK | Freq: Once | INTRAVENOUS | Status: AC | PRN
  Administered 2016-11-21: 10 via INTRAVENOUS

## 2016-12-02 ENCOUNTER — Ambulatory Visit (HOSPITAL_COMMUNITY)
Admission: RE | Admit: 2016-12-02 | Discharge: 2016-12-02 | Disposition: A | Discharge status: Home / Self Care | Payer: Medicaid Other | Source: Ambulatory Visit | Attending: Cardiology | Admitting: Cardiology

## 2016-12-02 ENCOUNTER — Encounter (HOSPITAL_COMMUNITY)
Admission: RE | Disposition: A | Discharge status: Home / Self Care | Payer: Self-pay | Source: Ambulatory Visit | Attending: Cardiology

## 2016-12-02 ENCOUNTER — Encounter (HOSPITAL_COMMUNITY): Payer: Self-pay | Admitting: *Deleted

## 2016-12-02 DIAGNOSIS — E119 Type 2 diabetes mellitus without complications: Secondary | ICD-10-CM | POA: Insufficient documentation

## 2016-12-02 DIAGNOSIS — I5022 Chronic systolic (congestive) heart failure: Secondary | ICD-10-CM | POA: Diagnosis not present

## 2016-12-02 DIAGNOSIS — Z7982 Long term (current) use of aspirin: Secondary | ICD-10-CM | POA: Insufficient documentation

## 2016-12-02 DIAGNOSIS — I2510 Atherosclerotic heart disease of native coronary artery without angina pectoris: Secondary | ICD-10-CM | POA: Insufficient documentation

## 2016-12-02 DIAGNOSIS — I110 Hypertensive heart disease with heart failure: Secondary | ICD-10-CM | POA: Insufficient documentation

## 2016-12-02 DIAGNOSIS — Z7984 Long term (current) use of oral hypoglycemic drugs: Secondary | ICD-10-CM | POA: Diagnosis not present

## 2016-12-02 HISTORY — PX: LEFT HEART CATH AND CORONARY ANGIOGRAPHY: CATH118249

## 2016-12-02 LAB — CARDIAC CATHETERIZATION: Cath EF Quantitative: 45 %

## 2016-12-02 LAB — GLUCOSE, CAPILLARY: Glucose-Capillary: 117 mg/dL — ABNORMAL HIGH (ref 65–99)

## 2016-12-02 SURGERY — LEFT HEART CATH AND CORONARY ANGIOGRAPHY

## 2016-12-02 MED ORDER — SODIUM CHLORIDE 0.9% FLUSH: 3.0000 mL | Freq: Two times a day (BID) | INTRAVENOUS | Status: DC

## 2016-12-02 MED ORDER — LIDOCAINE HCL (PF) 1 % IJ SOLN
INTRAMUSCULAR | Status: DC | PRN
  Administered 2016-12-02: 15 mL

## 2016-12-02 MED ORDER — ASPIRIN 81 MG PO CHEW
81.0000 mg | CHEWABLE_TABLET | ORAL | Status: AC
  Administered 2016-12-02: 81 mg via ORAL

## 2016-12-02 MED ORDER — MIDAZOLAM HCL 2 MG/2ML IJ SOLN
INTRAMUSCULAR | Status: DC | PRN
  Administered 2016-12-02 (×2): 1 mg via INTRAVENOUS

## 2016-12-02 MED ORDER — FENTANYL CITRATE (PF) 100 MCG/2ML IJ SOLN
INTRAMUSCULAR | Status: AC
  Filled 2016-12-02: qty 2

## 2016-12-02 MED ORDER — SODIUM CHLORIDE 0.9 % IV SOLN: INTRAVENOUS | Status: AC

## 2016-12-02 MED ORDER — SODIUM CHLORIDE 0.9% FLUSH: 3.0000 mL | INTRAVENOUS | Status: DC | PRN

## 2016-12-02 MED ORDER — OXYCODONE-ACETAMINOPHEN 5-325 MG PO TABS: 1.0000 | ORAL_TABLET | ORAL | Status: DC | PRN

## 2016-12-02 MED ORDER — HEPARIN (PORCINE) IN NACL 2-0.9 UNIT/ML-% IJ SOLN
INTRAMUSCULAR | Status: DC | PRN
  Administered 2016-12-02: 1000 mL via INTRA_ARTERIAL

## 2016-12-02 MED ORDER — ACETAMINOPHEN 325 MG PO TABS: 650.0000 mg | ORAL_TABLET | ORAL | Status: DC | PRN

## 2016-12-02 MED ORDER — IOPAMIDOL (ISOVUE-370) INJECTION 76%
INTRAVENOUS | Status: AC
  Filled 2016-12-02: qty 100

## 2016-12-02 MED ORDER — ASPIRIN 81 MG PO CHEW
CHEWABLE_TABLET | ORAL | Status: AC
  Filled 2016-12-02: qty 1

## 2016-12-02 MED ORDER — SODIUM CHLORIDE 0.9 % WEIGHT BASED INFUSION: 1.0000 mL/kg/h | INTRAVENOUS | Status: DC

## 2016-12-02 MED ORDER — LIDOCAINE HCL (PF) 1 % IJ SOLN
INTRAMUSCULAR | Status: AC
  Filled 2016-12-02: qty 30

## 2016-12-02 MED ORDER — IOPAMIDOL (ISOVUE-370) INJECTION 76%
INTRAVENOUS | Status: DC | PRN
Start: 2016-12-02 — End: 2016-12-02
  Administered 2016-12-02: 70 mL via INTRA_ARTERIAL

## 2016-12-02 MED ORDER — SODIUM CHLORIDE 0.9 % IV SOLN: 250.0000 mL | INTRAVENOUS | Status: DC | PRN

## 2016-12-02 MED ORDER — SODIUM CHLORIDE 0.9 % WEIGHT BASED INFUSION
3.0000 mL/kg/h | INTRAVENOUS | Status: AC
  Administered 2016-12-02: 3 mL/kg/h via INTRAVENOUS

## 2016-12-02 MED ORDER — FENTANYL CITRATE (PF) 100 MCG/2ML IJ SOLN
INTRAMUSCULAR | Status: DC | PRN
  Administered 2016-12-02 (×2): 25 ug via INTRAVENOUS

## 2016-12-02 MED ORDER — MIDAZOLAM HCL 2 MG/2ML IJ SOLN
INTRAMUSCULAR | Status: AC
  Filled 2016-12-02: qty 2

## 2016-12-02 MED ORDER — HEPARIN (PORCINE) IN NACL 2-0.9 UNIT/ML-% IJ SOLN
INTRAMUSCULAR | Status: AC
  Filled 2016-12-02: qty 1000

## 2016-12-02 MED ORDER — ONDANSETRON HCL 4 MG/2ML IJ SOLN: 4.0000 mg | Freq: Four times a day (QID) | INTRAMUSCULAR | Status: DC | PRN

## 2016-12-02 SURGICAL SUPPLY — 10 items
CATH EXPO 5F FL3.5 (CATHETERS) ×2 IMPLANT
CATH INFINITI 5FR MULTPACK ANG (CATHETERS) ×2 IMPLANT
DEVICE CLOSURE PERCLS PRGLD 6F (VASCULAR PRODUCTS) IMPLANT
KIT HEART LEFT (KITS) ×3 IMPLANT
PACK CARDIAC CATHETERIZATION (CUSTOM PROCEDURE TRAY) ×3 IMPLANT
PERCLOSE PROGLIDE 6F (VASCULAR PRODUCTS) ×3
SHEATH PINNACLE 5F 10CM (SHEATH) ×2 IMPLANT
SYR MEDRAD MARK V 150ML (SYRINGE) ×3 IMPLANT
TRANSDUCER W/STOPCOCK (MISCELLANEOUS) ×3 IMPLANT
WIRE EMERALD 3MM-J .035X150CM (WIRE) ×2 IMPLANT

## 2016-12-02 NOTE — Progress Notes (Signed)
Hospital interpretor with pt (Dustin Vang) as I was getting him ready and also sent to cath lab with pt.

## 2016-12-02 NOTE — H&P (Signed)
Dictated H&P in the chart needs to be scanned 

## 2016-12-02 NOTE — Discharge Instructions (Signed)
Chu?p ma?ch va?nh, Ch?m [place] sau thu? thu?t (Angiogram, Care After) Hy tham kh?o t? thng tin ny trong vi tu?n t?i. Nh?ng h??ng d?n ny cung c?p cho qu v? thng tin v? cch ch?m [place] b?n thn sau khi lm th? thu?t. Chuyn gia ch?m [place] s?c kh?e c?ng c th? h??ng d?n c? th? h?n cho qu v?. Vi?c ?i?u tr? c?a qu v? ? ???c ln k? ho?ch theo th?c hnh y khoa hi?n t?i, nh?ng v?n ?? ?i khi v?n x?y ra. Hy g?i cho chuyn gia ch?m [place] s?c kh?e n?u qu v? c b?t k? v?n ?? ho?c th?c m?c no sau khi lm th? thu?t. D? KI?N ?I?U G X?Y RA SAU TH? THU?T Sau khi lm th? thu?t th??ng c nh?ng v?n ?? sau:  B?m tm t?i ch? ???t ?ng thng th??ng se? m?t d?n trong vng 1-2 tu?n.  Mu ti?ch tu? trong m (kh?i t? mu) m c th? ?au ??n khi ch?m vo. N thng th??ng gi?m kch th??c v gia?m nha?y ca?m ?au trong vng 1-2 tu?n. H??NG D?N CH?M [place] T?I NH  Ch? s? d?ng thu?c theo ch? d?n c?a chuyn gia ch?m [place] s?c kh?e.  Quy? vi? c th? t?m vo?i hoa sen 24-48 gi? sau ph?u thu?t ho?c theo chi? d?n cu?a chuyn gia ch?m [place] s?c kh?e c?a quy? vi?. Tha?o b?ng (b?ng) v nh? nhng r?a ch? b?ng b?ng x phng v n??c. V? nh? b?ng kh?n s?ch ?? lm kh vng ny. Khng ch xa?t va?o ch? ???t ?ng thng v c th? gy ch?y mu.  Khng t?m, b?i, ho?c s? d?ng b?n t?m n??c nng cho ??n khi chuyn gia ch?m [place] s?c kh?e c?a qu v? ch?p thu?n.  Ki?m tra ch? ???t ?ng m?i ngy xem co? t?y ??, s?ng, ho?c d?n l?u di?ch hay khng.  Khng bi thu?c b?t ho?c kem d??ng va?o ch? ???t ?ng thng.  Khng nng n??ng h?n 10 lb (4,5 kg) sau 5 nga?y sau khi lm th? thu?t ho?c theo ch? d?n c?a chuyn gia ch?m [place] s?c kh?e c?a quy? vi?.  Ho?i chuyn gia ch?m so?c s??c kho?e khi na?o co? th?:  [place] tr? l?i lm vi?c ho??c ?i h?c.  Ti?p t?c cc ho?t ??ng th? ch?t thng th??ng ho??c th? thao.  Ti?p t?c quan h? tnh d?c.  Khng la?i xe v? nha? n?u quy? vi? ????c ra vi?n ngay trong nga?y la?m thu? thu?t. Nh?? ai ?o? la?i xe ch?? quy?  vi?.  Quy? vi? c th? li xe 24 gi? sau khi la?m thu? thu?t tr? khi ???c chuyn gia ch?m [place] s?c kh?e c?a quy? vi? h???ng d?n kha?c.  Khng v?n ha?nh my mc ha?ng n??ng ho??c ca?c du?ng cu? ma?y trong 24 gi? sau khi la?m thu? thu?t ho?c theo chi? d?n cu?a chuyn gia ch?m [place] s?c kh?e c?a quy? vi?.  N?u th? thu?t na?y la? m?t th? thu?t ngo?i tr, c ngh?a la? quy? vi? v? nh trong cng ngy thu? thu?t, c?n co? m?t ng??i l?n ch?u trch nhi?m ?? cu?ng quy? vi? trong 24 gi? ??u tin sau khi v? nh.  Tun th? t?t c? cc cu?c h?n khm l?i theo ch? d?n c?a chuyn gia ch?m [place] s?c kh?e. ?i?u ny c vai tr quan tr?ng. ?I [facility] N?U:  Qu v? b? s?t.  Qu v? b? ?n l?nh.  Quy? vi? bi? ch?y mu t?ng ln t? ch? ???t ?ng thng. A?p ch??t ln ch? ???t ?ng thng. NGAY L?P T?C ?I [facility] N?U:  Quy? vi? bi? ??u b?t th??ng ?? ch? ???t ?ng thng.  Quy? vi? bi? t?y ??, ?m, ho?c s?ng t?i ch? ???t ?ng thng.  Quy? vi? co? d?n l?u (ngoa?i m?t l??ng mu nho? th?m va?o b?ng) t? ch? ???t ?ng thng.  Ch? ???t ?ng thng bi? ch?y mu v ch?y mu khng ng?ng sau 30 pht a?p ch??t ln ch? ?o?.  Khu v?c g?n ho?c ngay trn ch? ???t ?ng thng bi? nh??t nh?t, la?nh, ng??a ran ho?c t. Thng tin ny khng nh?m m?c ?ch thay th? cho l?i khuyn m chuyn gia ch?m [place] s?c kh?e ni v?i qu v?. Hy b?o ??m qu v? ph?i th?o lu?n b?t k? v?n ?? g m qu v? c v?i chuyn gia ch?m [place] s?c kh?e c?a qu v?. Document Released: 12/24/2015 Document Revised: 12/24/2015 Document Reviewed: 02/02/2013 Elsevier Interactive Patient Education  2017 [employer] Inc. Femoral Site Care Refer to this sheet in the next few weeks. These instructions provide you with information about caring for yourself after your procedure. Your health care provider may also give you more specific instructions. Your treatment has been planned according to current medical practices, but problems sometimes occur. Call your health care provider if you  have any problems or questions after your procedure. What can I expect after the procedure? After your procedure, it is typical to have the following:  Bruising at the site that usually fades within 1-2 weeks.  Blood collecting in the tissue (hematoma) that may be painful to the touch. It should usually decrease in size and tenderness within 1-2 weeks. Follow these instructions at home:  Take medicines only as directed by your health care provider.  You may shower 24-48 hours after the procedure or as directed by your health care provider. Remove the bandage (dressing) and gently wash the site with plain soap and water. Pat the area dry with a clean towel. Do not rub the site, because this may cause bleeding.  Do not take baths, swim, or use a hot tub until your health care provider approves.  Check your insertion site every day for redness, swelling, or drainage.  Do not apply powder or lotion to the site.  Limit use of stairs to twice a day for the first 2-3 days or as directed by your health care provider.  Do not squat for the first 2-3 days or as directed by your health care provider.  Do not lift over 10 lb (4.5 kg) for 5 days after your procedure or as directed by your health care provider.  Ask your health care provider when it is okay to:  Return to work or school.  Resume usual physical activities or sports.  Resume sexual activity.  Do not drive home if you are discharged the same day as the procedure. Have someone else drive you.  You may drive 24 hours after the procedure unless otherwise instructed by your health care provider.  Do not operate machinery or power tools for 24 hours after the procedure or as directed by your health care provider.  If your procedure was done as an outpatient procedure, which means that you went home the same day as your procedure, a responsible adult should be with you for the first 24 hours after you arrive home.  Keep all  follow-up visits as directed by your health care provider. This is important. Contact a health care provider if:  You have a fever.  You have chills.  You have increased bleeding from the site.  Hold pressure on the site. Get help right away if:  You have unusual pain at the site.  You have redness, warmth, or swelling at the site.  You have drainage (other than a small amount of blood on the dressing) from the site.  The site is bleeding, and the bleeding does not stop after 30 minutes of holding steady pressure on the site.  Your leg or foot becomes pale, cool, tingly, or numb. This information is not intended to replace advice given to you by your health care provider. Make sure you discuss any questions you have with your health care provider. Document Released: 05/05/2014 Document Revised: 02/07/2016 Document Reviewed: 03/21/2014 Elsevier Interactive Patient Education  2017 [employer]

## 2016-12-02 NOTE — Interval H&P Note (Signed)
Cath Lab Visit (complete for each Cath Lab visit)  Clinical Evaluation Leading to the Procedure:   ACS: No.  Non-ACS:    Anginal Classification: CCS III  Anti-ischemic medical therapy: Maximal Therapy (2 or more classes of medications)  Non-Invasive Test Results: Intermediate-risk stress test findings: cardiac mortality 1-3%/year  Prior CABG: No previous CABG      History and Physical Interval Note:  12/02/2016 7:48 AM  Dustin Vang  has presented today for surgery, with the diagnosis of abnormal stress test  The various methods of treatment have been discussed with the patient and family. After consideration of risks, benefits and other options for treatment, the patient has consented to  Procedure(s): Left Heart Cath and Coronary Angiography (N/A) as a surgical intervention .  The patient's history has been reviewed, patient examined, no change in status, stable for surgery.  I have reviewed the patient's chart and labs.  Questions were answered to the patient's satisfaction.     Dustin Vang

## 2017-01-28 ENCOUNTER — Encounter: Payer: Self-pay | Admitting: Family Medicine

## 2019-11-24 ENCOUNTER — Ambulatory Visit: Payer: Medicaid Other | Attending: Internal Medicine

## 2019-11-24 DIAGNOSIS — Z23 Encounter for immunization: Secondary | ICD-10-CM

## 2019-11-24 NOTE — Progress Notes (Signed)
   Covid-19 Vaccination Clinic  Name:  Dustin Vang    MRN: [id number] DOB: 03-05-1951  11/24/2019  [name] was observed post Covid-19 immunization for 15 minutes without incident. He was provided with Vaccine Information Sheet and instruction to access the V-Safe system.   [name] was instructed to call 911 with any severe reactions post vaccine: Marland Kitchen Difficulty breathing  . Swelling of face and throat  . A fast heartbeat  . A bad rash all over body  . Dizziness and weakness   Immunizations Administered    Name Date Dose VIS Date Route   Pfizer COVID-19 Vaccine 11/24/2019 11:18 AM 0.3 mL 08/26/2019 Intramuscular   Manufacturer: [employer], [employer]   Lot: [id number]   NDC: [address]-9

## 2019-12-19 ENCOUNTER — Ambulatory Visit: Payer: Medicaid Other | Attending: Internal Medicine

## 2019-12-19 DIAGNOSIS — Z23 Encounter for immunization: Secondary | ICD-10-CM

## 2019-12-19 NOTE — Progress Notes (Signed)
   Covid-19 Vaccination Clinic  Name:  Dustin Vang    MRN: [id number] DOB: [birth year]  12/19/2019  [name] was observed post Covid-19 immunization for 15 minutes without incident. He was provided with Vaccine Information Sheet and instruction to access the V-Safe system.   [name] was instructed to call 911 with any severe reactions post vaccine: Marland Kitchen Difficulty breathing  . Swelling of face and throat  . A fast heartbeat  . A bad rash all over body  . Dizziness and weakness   Immunizations Administered    Name Date Dose VIS Date Route   Pfizer COVID-19 Vaccine 12/19/2019 12:18 PM 0.3 mL 08/26/2019 Intramuscular   Manufacturer: [employer], [employer]   Lot: [id number]   NDC: [address]-2

## 2021-04-13 ENCOUNTER — Emergency Department (HOSPITAL_COMMUNITY): Payer: Medicaid Other

## 2021-04-13 ENCOUNTER — Emergency Department (HOSPITAL_COMMUNITY)
Admission: EM | Admit: 2021-04-13 | Discharge: 2021-04-13 | Disposition: A | Discharge status: Home / Self Care | Payer: Medicaid Other | Attending: Emergency Medicine | Admitting: Emergency Medicine

## 2021-04-13 DIAGNOSIS — I5022 Chronic systolic (congestive) heart failure: Secondary | ICD-10-CM | POA: Diagnosis not present

## 2021-04-13 DIAGNOSIS — Z7982 Long term (current) use of aspirin: Secondary | ICD-10-CM | POA: Insufficient documentation

## 2021-04-13 DIAGNOSIS — Z87891 Personal history of nicotine dependence: Secondary | ICD-10-CM | POA: Diagnosis not present

## 2021-04-13 DIAGNOSIS — Z7984 Long term (current) use of oral hypoglycemic drugs: Secondary | ICD-10-CM | POA: Diagnosis not present

## 2021-04-13 DIAGNOSIS — S6991XA Unspecified injury of right wrist, hand and finger(s), initial encounter: Secondary | ICD-10-CM

## 2021-04-13 DIAGNOSIS — E119 Type 2 diabetes mellitus without complications: Secondary | ICD-10-CM | POA: Insufficient documentation

## 2021-04-13 DIAGNOSIS — Z79899 Other long term (current) drug therapy: Secondary | ICD-10-CM | POA: Insufficient documentation

## 2021-04-13 DIAGNOSIS — W28XXXA Contact with powered lawn mower, initial encounter: Secondary | ICD-10-CM | POA: Insufficient documentation

## 2021-04-13 DIAGNOSIS — I110 Hypertensive heart disease with heart failure: Secondary | ICD-10-CM | POA: Insufficient documentation

## 2021-04-13 DIAGNOSIS — I2510 Atherosclerotic heart disease of native coronary artery without angina pectoris: Secondary | ICD-10-CM | POA: Insufficient documentation

## 2021-04-13 DIAGNOSIS — S61212A Laceration without foreign body of right middle finger without damage to nail, initial encounter: Secondary | ICD-10-CM | POA: Insufficient documentation

## 2021-04-13 MED ORDER — CEPHALEXIN 500 MG PO CAPS: 500.0000 mg | ORAL_CAPSULE | Freq: Four times a day (QID) | ORAL | 0 refills | Status: AC

## 2021-04-13 MED ORDER — CEPHALEXIN 250 MG PO CAPS
500.0000 mg | ORAL_CAPSULE | Freq: Once | ORAL | Status: AC
  Administered 2021-04-13: 500 mg via ORAL
  Filled 2021-04-13: qty 2

## 2021-04-13 MED ORDER — ACETAMINOPHEN 325 MG PO TABS
650.0000 mg | ORAL_TABLET | Freq: Once | ORAL | Status: AC
  Administered 2021-04-13: 650 mg via ORAL
  Filled 2021-04-13: qty 2

## 2021-04-13 MED ORDER — IBUPROFEN 400 MG PO TABS
400.0000 mg | ORAL_TABLET | Freq: Once | ORAL | Status: AC
  Administered 2021-04-13: 400 mg via ORAL
  Filled 2021-04-13: qty 1

## 2021-04-13 NOTE — ED Provider Notes (Signed)
[facility] EMERGENCY DEPARTMENT Provider Note   CSN: [id number] Arrival date & time: 04/13/21  1639     History Chief Complaint  Patient presents with   finger laceration    Dustin Vang is a 70 y.o. male presenting for finger laceration.  Patient states he was fixing something in the lawnmower but forgot it off, and his finger got caught in the lawnmower.  He reports acute onset pain.  No injury elsewhere.  Tetanus was updated last year.  He is not on blood thinners.  He reports his pain is a 2 out of 10 currently.  No numbness.  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   DM type 2 (diabetes mellitus, type 2) (HCC) 05/30/2014   Seborrhea 05/30/2014   CAD (coronary artery disease) 05/30/2014   Elevated serum creatinine 05/30/2014   Hypertension 05/14/2013    Past Surgical History:  Procedure Laterality Date   CARDIAC CATHETERIZATION N/A 06/07/2015   Procedure: Left Heart Cath and Coronary Angiography;  Surgeon: Orpah Cobb, MD;  Location: [facility] INVASIVE CV LAB;  Service: Cardiovascular;  Laterality: N/A;   CORONARY STENT PLACEMENT  05/11/2013    heart     LEFT HEART CATH AND CORONARY ANGIOGRAPHY N/A 12/02/2016   Procedure: Left Heart Cath and Coronary Angiography;  Surgeon: Rinaldo Cloud, MD;  Location: [facility] INVASIVE CV LAB;  Service: Cardiovascular;  Laterality: N/A;   LEFT HEART CATHETERIZATION WITH CORONARY ANGIOGRAM Bilateral 05/09/2013   Procedure: LEFT HEART CATHETERIZATION WITH CORONARY ANGIOGRAM;  Surgeon: Robynn Pane, MD;  Location: [facility] CATH  LAB;  Service: Cardiovascular;  Laterality: Bilateral;   LEFT HEART CATHETERIZATION WITH CORONARY ANGIOGRAM N/A 05/11/2013   Procedure: LEFT HEART CATHETERIZATION WITH CORONARY ANGIOGRAM;  Surgeon: Robynn Pane, MD;  Location: [facility] CATH LAB;  Service: Cardiovascular;  Laterality: N/A;   NO PAST SURGERIES         Family History  Problem Relation Age of Onset   Diabetes Mother    Hypertension Mother    Cancer Neg Hx     Social History   Tobacco Use   Smoking status: Former    Packs/day: 0.50    Years: 30.00    Pack years: 15.00    Types: Cigarettes    Quit date: 09/15/1998    Years since quitting: 22.5   Smokeless tobacco: Never  Substance Use Topics   Alcohol use: No   Drug use: No    Home Medications Prior to Admission medications   Medication Sig Start Date End Date Taking? Authorizing Provider  cephALEXin (KEFLEX) 500 MG capsule Take 1 capsule (500 mg total) by mouth 4 (four) times daily for 5 days. 04/13/21 04/18/21 Yes Kyland No, PA-C  amLODipine (NORVASC) 5 MG tablet Take 1 tablet (5 mg total) by mouth daily. 06/21/15   Dessa Phi, MD  aspirin 81 MG EC tablet Take 1 tablet (81 mg total) by mouth daily. 06/21/15   Dessa Phi, MD  atorvastatin (LIPITOR) 80 MG tablet Take 1 tablet (80 mg total) by mouth daily at 6 PM. 06/21/15  Funches, Josalyn, MD  BRILINTA 90 MG TABS tablet TAKE 1 TABLET BY MOUTH TWICE DAILY, NEED OFFICE VISIT FOR MOR REFILLS 02/12/16   Funches, Josalyn, MD  losartan-hydrochlorothiazide (HYZAAR) 100-12.5 MG tablet Take 1 tablet by mouth daily. 06/21/15   Funches, Gerilyn Nestle, MD  metFORMIN (GLUCOPHAGE) 500 MG tablet Take 1 tablet (500 mg total) by mouth daily. Must have office visit 02/12/16   Dessa Phi, MD  metoprolol (LOPRESSOR) 50 MG tablet TAKE 1 TABLET(50 MG) BY MOUTH TWICE DAILY 03/14/16   Funches, Josalyn, MD  nitroGLYCERIN (NITROSTAT) 0.4 MG SL tablet Place 0.4 mg under the tongue every 5 (five) minutes as needed for chest pain.     [provider]    Allergies    Precedex [dexmedetomidine hcl in nacl]  Review of Systems   Review of Systems  Skin:  Positive for wound.  Hematological:  Does not bruise/bleed easily.   Physical Exam Updated Vital Signs BP (!) 152/88   Pulse 81   Temp 97.9 F (36.6 C) (Oral)   Resp 15   SpO2 98%   Physical Exam Vitals and nursing note reviewed.  Constitutional:      General: He is not in acute distress.    Appearance: He is well-developed.  HENT:     Head: Normocephalic and atraumatic.  Eyes:     Extraocular Movements: Extraocular movements intact.  Cardiovascular:     Rate and Rhythm: Normal rate.  Pulmonary:     Effort: Pulmonary effort is normal.  Abdominal:     General: There is no distension.  Musculoskeletal:        General: Normal range of motion.     Cervical back: Normal range of motion.     Comments: See picture below.  Soft tissue injury of the pad of the right middle finger without significant nail or nailbed involvement.  Area is macerated without suturable skin.  Minimal oozing. Full active range of motion of the finger at each joint when held in isolation.  Good distal sensation and cap refill.  Skin:    General: Skin is warm.     Findings: No rash.  Neurological:     Mental Status: He is alert and oriented to person, place, and time.           ED Results / Procedures / Treatments   Labs (all labs ordered are listed, but only abnormal results are displayed) Labs Reviewed - No data to display  EKG None  Radiology DG Finger Middle Right  Result Date: 04/13/2021 CLINICAL DATA:  Laceration right long finger on a lawn mower today. Initial encounter. EXAM: RIGHT MIDDLE FINGER 2+V COMPARISON:  None. FINDINGS: The patient is a laceration of the distal long finger and a nondisplaced fracture of the tuft. No radiopaque foreign body is identified. Bandaging noted. IMPRESSION: Nondisplaced fracture of the tuft of the long finger subjacent  to a laceration. Electronically Signed   By: Drusilla Kanner M.D.   On: 04/13/2021 17:55    Procedures Procedures   Medications Ordered in ED Medications  ibuprofen (ADVIL) tablet 400 mg (has no administration in time range)  cephALEXin (KEFLEX) capsule 500 mg (has no administration in time range)  acetaminophen (TYLENOL) tablet 650 mg (650 mg Oral Given 04/13/21 1704)    ED Course  I have reviewed the triage vital signs and the nursing notes.  Pertinent labs & imaging results that were available during my care of the patient were reviewed by me and considered in my medical  decision making (see chart for details).    MDM Rules/Calculators/A&P                           Patient presented for evaluation of finger injury.  On exam, patient is neurovascular intact.  He does have a significant soft tissue injury.  X-ray obtained from triage viewed and independently interpreted by me, is consistent with a tuft fracture.  However there is no nailbed involvement.  Due to the extent of the injury, will consult with hand.  Discussed with Dr. Arita Miss from hand surgery who recommends wound care with Xeroform, bulky dressing, and antibiotics.  Does not recommend any procedure or revision.  Discussed findings and plan with patient and daughter.  Wound care performed.  At this time, patient appears safe for discharge.  Return precautions given.  Patient states he understands and agrees to plan.   Final Clinical Impression(s) / ED Diagnoses Final diagnoses:  Finger injury, right, initial encounter    Rx / DC Orders ED Discharge Orders          Ordered    cephALEXin (KEFLEX) 500 MG capsule  4 times daily        04/13/21 2023             Alveria Apley, PA-C 04/13/21 2029    Vanetta Mulders, MD 04/18/21 1245

## 2021-04-13 NOTE — ED Triage Notes (Signed)
Pt here POV with c/o finger injury to right distal middle finger. Pt got it stuck in lawn mower when attempting to fix a part. Bleeding controlled. Pulses intact. Pt able to move all fingers.

## 2021-04-13 NOTE — ED Provider Notes (Signed)
Emergency Medicine Provider Triage Evaluation Note  Dustin Vang , a 70 y.o. male  was evaluated in triage.  Pt complains of laceration to finger happening 20 minutes prior to arrival.  Patient is left-hand dominant.  He got his right middle finger caught in the lawnmower.  He reports he has throbbing pain localized to the wound.  No medication for symptoms prior to arrival..  Tetanus is not up-to-date, was last done in 2016.  Review of Systems  Positive: Laceration Negative: Numbness, tingling  Physical Exam  BP (!) 158/89 (BP Location: Left Arm)   Pulse 85   Temp 98.7 F (37.1 C) (Oral)   Resp 18   SpO2 100%  Gen:   Awake, no distress   Resp:  Normal effort  MSK:   Moves extremities without difficulty  Other:  Laceration and defect noted to tip of right middle finger.  Nailbed appears to be intact.  Strong radial pulses.  Able to fully bend and extend the finger.  Bleeding controlled with pressure.  Medical Decision Making  Medically screening exam initiated at 4:59 PM.  Appropriate orders placed.  Dustin Vang was informed that the remainder of the evaluation will be completed by another provider, this initial triage assessment does not replace that evaluation, and the importance of remaining in the ED until their evaluation is complete.  X-ray ordered.  Tylenol given for pain.   Portions of this note were generated with Scientist, clinical (histocompatibility and immunogenetics). Dictation errors may occur despite best attempts at proofreading.    Dustin Ace, PA-C 04/13/21 1701    Terald Sleeper, MD 04/14/21 [phone]

## 2021-04-13 NOTE — Discharge Instructions (Addendum)
Call the office listed below to set up a follow-up appointment. Take antibiotics as prescribed. Use Tylenol and/or ibuprofen as needed for pain.  Use ice to help with pain and swelling. Keep your hand elevated to help with pain. Keep the dressing on until you follow-up with the hand doctor. Return to the ER if you develop fever, pus draining from the area, or any new, worsening, or concerning symptoms  G?i cho v?n phng ???c li?t k d??i ?y ?? ??t l?ch h?n ti khm. U?ng thu?c khng sinh theo ch? ??nh. S? d?ng Tylenol v / ho?c ibuprofen khi c?n ?? gi?m ?au. Ch??m ? ?? gi?m ?au v s?ng t?y. Nng cao tay ?? ?? ?au. Gi? b?ng cho ??n khi b?n ti khm v?i bc s? tay. Quay tr? l?i phng c?p c?u n?u b?n b? s?t, ch?y m? t? khu v?c ho?c b?t k? tri?u ch?ng m?i, t?i t? h?n ho?c lin quan ??n

## 2021-04-13 NOTE — ED Notes (Signed)
E-signature pad unavailable at time of pt discharge. This RN discussed discharge materials with pt and answered all pt questions. Pt stated understanding of discharge material. ? ?

## 2021-05-08 ENCOUNTER — Ambulatory Visit (INDEPENDENT_AMBULATORY_CARE_PROVIDER_SITE_OTHER): Payer: Medicaid Other | Admitting: Plastic Surgery

## 2021-05-08 ENCOUNTER — Other Ambulatory Visit: Payer: Self-pay

## 2021-05-08 DIAGNOSIS — S6991XA Unspecified injury of right wrist, hand and finger(s), initial encounter: Secondary | ICD-10-CM | POA: Diagnosis not present

## 2021-05-08 NOTE — Progress Notes (Signed)
Referring Provider Dessa Phi, MD No address on file   CC:  Chief Complaint  Patient presents with   Advice Only      Dustin Vang is an 70 y.o. male.  HPI: Patient presents for evaluation of right long finger injury.  This occurred about 3 weeks ago.  He accidentally got the tip of his finger amputated by a lawnmower.  He was seen in the emergency room and dressing changes were started and he comes to me for follow-up.  He feels like things are going reasonably well.  Allergies  Allergen Reactions   Precedex [Dexmedetomidine Hcl In Nacl]     Bradycardia/arrest    Outpatient Encounter Medications as of 05/08/2021  Medication Sig   amLODipine (NORVASC) 5 MG tablet Take 1 tablet (5 mg total) by mouth daily.   aspirin 81 MG EC tablet Take 1 tablet (81 mg total) by mouth daily.   atorvastatin (LIPITOR) 80 MG tablet Take 1 tablet (80 mg total) by mouth daily at 6 PM.   metFORMIN (GLUCOPHAGE) 500 MG tablet Take 1 tablet (500 mg total) by mouth daily. Must have office visit   metoprolol (LOPRESSOR) 50 MG tablet TAKE 1 TABLET(50 MG) BY MOUTH TWICE DAILY   nitroGLYCERIN (NITROSTAT) 0.4 MG SL tablet Place 0.4 mg under the tongue every 5 (five) minutes as needed for chest pain.   valsartan-hydrochlorothiazide (DIOVAN-HCT) 320-25 MG tablet Take 1 tablet by mouth daily.   [DISCONTINUED] BRILINTA 90 MG TABS tablet TAKE 1 TABLET BY MOUTH TWICE DAILY, NEED OFFICE VISIT FOR MOR REFILLS   [DISCONTINUED] losartan-hydrochlorothiazide (HYZAAR) 100-12.5 MG tablet Take 1 tablet by mouth daily.   No facility-administered encounter medications on file as of 05/08/2021.     Past Medical History:  Diagnosis Date   Anginal pain (HCC)    Diabetes mellitus without complication (HCC) aug 2014   Hypertension 2000   Myocardial infarction 05/09/2013    Past Surgical History:  Procedure Laterality Date   CARDIAC CATHETERIZATION N/A 06/07/2015   Procedure: Left Heart Cath and Coronary Angiography;   Surgeon: Orpah Cobb, MD;  Location: [facility] INVASIVE CV LAB;  Service: Cardiovascular;  Laterality: N/A;   CORONARY STENT PLACEMENT  05/11/2013    heart     LEFT HEART CATH AND CORONARY ANGIOGRAPHY N/A 12/02/2016   Procedure: Left Heart Cath and Coronary Angiography;  Surgeon: Rinaldo Cloud, MD;  Location: [facility] INVASIVE CV LAB;  Service: Cardiovascular;  Laterality: N/A;   LEFT HEART CATHETERIZATION WITH CORONARY ANGIOGRAM Bilateral 05/09/2013   Procedure: LEFT HEART CATHETERIZATION WITH CORONARY ANGIOGRAM;  Surgeon: Robynn Pane, MD;  Location: [facility] CATH LAB;  Service: Cardiovascular;  Laterality: Bilateral;   LEFT HEART CATHETERIZATION WITH CORONARY ANGIOGRAM N/A 05/11/2013   Procedure: LEFT HEART CATHETERIZATION WITH CORONARY ANGIOGRAM;  Surgeon: Robynn Pane, MD;  Location: [facility] CATH LAB;  Service: Cardiovascular;  Laterality: N/A;   NO PAST SURGERIES      Family History  Problem Relation Age of Onset   Diabetes Mother    Hypertension Mother    Cancer Neg Hx     Social History   Social History Narrative   Live at home    With wife and youngest daughter.   Does not speak [place].   Daughter speaks [place].   From [place].    Moved to [place] in 2007.      Review of Systems General: Denies fevers, chills, weight loss CV: Denies chest pain, shortness of breath, palpitations  Physical Exam Vitals with BMI  04/13/2021 04/13/2021 04/13/2021  Height - - -  Weight - - -  BMI - - -  Systolic 136 152 169  Diastolic 78 88 80  Pulse 60 81 66    General:  No acute distress,  Alert and oriented, Non-Toxic, Normal speech and affect Right hand: Fingers well-perfused with normal capillary refill to palp radial pulse.  Sensation is intact throughout.  He has full range of motion.  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  The majority of the pulp is intact.  The majority the nailbed is intact.  It is just the most distal aspect.  The wound bed is mostly healthy with a small amount  of fibrinous tissue.  No purulence or signs of infection.  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  I think this will be best treated with dressing changes.  We went over wound care instructions.  I like him to change the wound dressing daily.  I will plan to see him again in 3 weeks to check his progress.  All of his questions were answered.  Allena Napoleon 05/08/2021, 1:27 PM

## 2021-05-29 ENCOUNTER — Other Ambulatory Visit: Payer: Self-pay

## 2021-05-29 ENCOUNTER — Encounter: Payer: Self-pay | Admitting: Plastic Surgery

## 2021-05-29 ENCOUNTER — Ambulatory Visit (INDEPENDENT_AMBULATORY_CARE_PROVIDER_SITE_OTHER): Payer: Medicaid Other | Admitting: Plastic Surgery

## 2021-05-29 DIAGNOSIS — S6991XA Unspecified injury of right wrist, hand and finger(s), initial encounter: Secondary | ICD-10-CM | POA: Diagnosis not present

## 2021-05-29 NOTE — Progress Notes (Signed)
   Referring Provider Dessa Phi, MD No address on file   CC:  Chief Complaint  Patient presents with   Follow-up      Dustin Vang is an 70 y.o. male.  HPI: Patient presents for follow-up regarding his finger injury.  He had an oblique amputation of the long fingertip.  We allow this to heal with secondary intention.  He feels like things are going well.  Review of Systems General: Denies fevers and chills  Physical Exam Vitals with BMI 04/13/2021 04/13/2021 04/13/2021  Height - - -  Weight - - -  BMI - - -  Systolic 136 152 474  Diastolic 78 88 80  Pulse 60 81 66    General:  No acute distress,  Alert and oriented, Non-Toxic, Normal speech and affect Examination shows epithelialization of the wound.  There is a bit of firmness to this tissue at the moment but no surrounding redness or signs of infection.  No drainage.  He has normal range of motion and sensation otherwise.    Assessment/Plan I suspect that this outer layer of epithelialization will ultimately flake off revealing a softer texture.  I am planning to see him again in 2 months to check his progress.  If he has any questions or concerns in the meantime he is welcome to call the office.  All of his questions were answered.  Allena Napoleon 05/29/2021, 9:58 AM

## 2021-07-18 ENCOUNTER — Ambulatory Visit: Payer: Medicaid Other | Admitting: Plastic Surgery

## 2021-09-05 IMAGING — DX DG FINGER MIDDLE 2+V*R*
3 series · 3 of 3 positions shown · non-contrast
Comparison: None.

CLINICAL DATA: Laceration right long finger on a lawn mower today.
Initial encounter.

EXAM:
RIGHT MIDDLE FINGER 2+V

[finger ap]
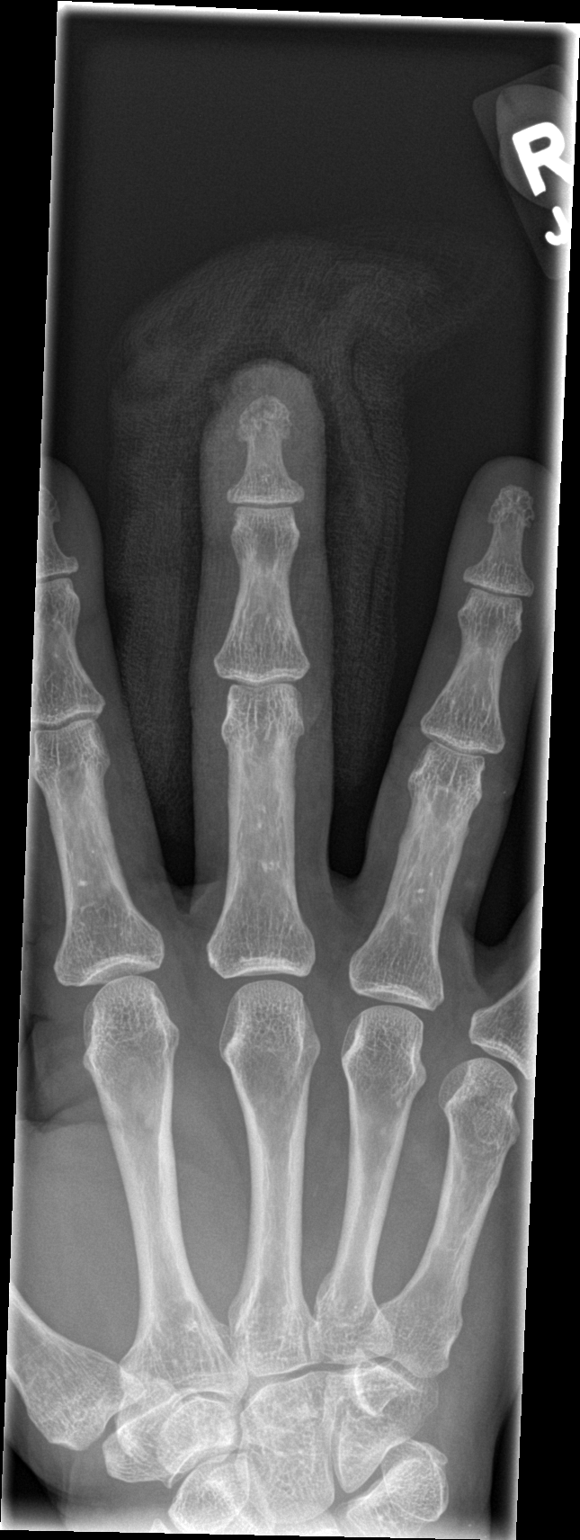

[finger obl]
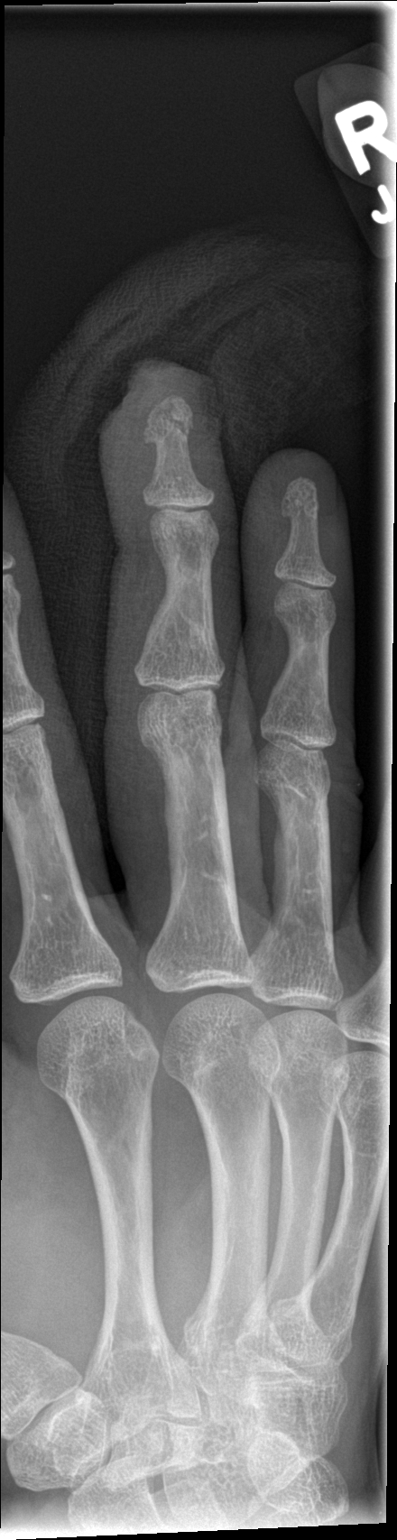

[finger lat]
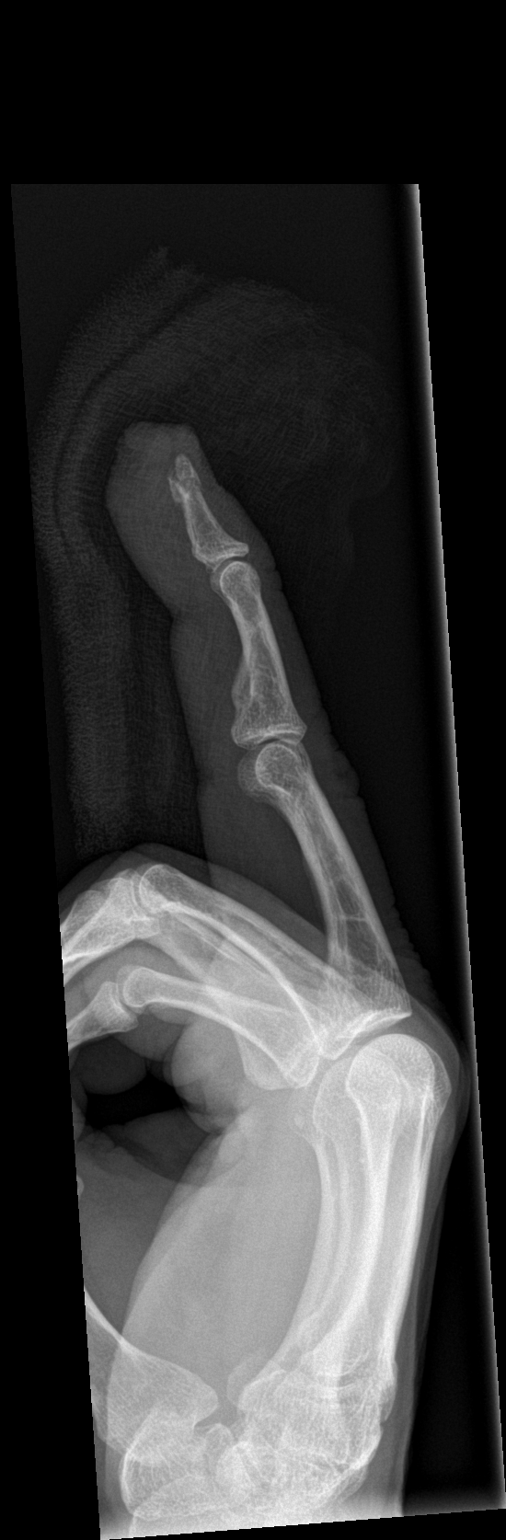

[3 of 3 positions shown; findings below may reference images not displayed]

FINDINGS: The patient is a laceration of the distal long finger and a
nondisplaced fracture of the tuft. No radiopaque foreign body is
identified. Bandaging noted.
IMPRESSION: Nondisplaced fracture of the tuft of the long finger subjacent to a
laceration.
